# Patient Record
Sex: Male | Born: 1950 | Race: White | Hispanic: No | Marital: Married | Smoking: Never smoker
Health system: Southern US, Community
[De-identification: ages and names within clinical notes are randomized; demographics above are authoritative.]

## PROBLEM LIST (undated history)

## (undated) DIAGNOSIS — D6851 Activated protein C resistance: Secondary | ICD-10-CM

## (undated) DIAGNOSIS — R42 Dizziness and giddiness: Secondary | ICD-10-CM

## (undated) DIAGNOSIS — Z9889 Other specified postprocedural states: Secondary | ICD-10-CM

## (undated) DIAGNOSIS — H401130 Primary open-angle glaucoma, bilateral, stage unspecified: Secondary | ICD-10-CM

## (undated) DIAGNOSIS — I2699 Other pulmonary embolism without acute cor pulmonale: Secondary | ICD-10-CM

## (undated) DIAGNOSIS — R112 Nausea with vomiting, unspecified: Secondary | ICD-10-CM

## (undated) DIAGNOSIS — K219 Gastro-esophageal reflux disease without esophagitis: Secondary | ICD-10-CM

## (undated) HISTORY — DX: Primary open-angle glaucoma, bilateral, stage unspecified: H40.1130

## (undated) HISTORY — DX: Dizziness and giddiness: R42

## (undated) HISTORY — DX: Activated protein C resistance: D68.51

## (undated) HISTORY — DX: Other pulmonary embolism without acute cor pulmonale: I26.99

## (undated) HISTORY — PX: SHOULDER ARTHROSCOPY: SHX128

## (undated) HISTORY — PX: OTHER SURGICAL HISTORY: SHX169

---

## 2009-08-20 ENCOUNTER — Emergency Department (HOSPITAL_BASED_OUTPATIENT_CLINIC_OR_DEPARTMENT_OTHER): Admission: EM | Admit: 2009-08-20 | Discharge: 2009-08-20 | Payer: Self-pay | Admitting: Emergency Medicine

## 2009-08-20 ENCOUNTER — Ambulatory Visit: Payer: Self-pay | Admitting: Diagnostic Radiology

## 2009-08-31 ENCOUNTER — Emergency Department (HOSPITAL_BASED_OUTPATIENT_CLINIC_OR_DEPARTMENT_OTHER): Admission: EM | Admit: 2009-08-31 | Discharge: 2009-08-31 | Payer: Self-pay | Admitting: Emergency Medicine

## 2009-08-31 ENCOUNTER — Ambulatory Visit: Payer: Self-pay | Admitting: Diagnostic Radiology

## 2010-03-23 HISTORY — PX: BACK SURGERY: SHX140

## 2010-06-09 LAB — URINE MICROSCOPIC-ADD ON

## 2010-06-09 LAB — URINALYSIS, ROUTINE W REFLEX MICROSCOPIC
Bilirubin Urine: NEGATIVE
Glucose, UA: NEGATIVE mg/dL
Ketones, ur: NEGATIVE mg/dL
Leukocytes, UA: NEGATIVE

## 2010-06-20 ENCOUNTER — Other Ambulatory Visit (HOSPITAL_COMMUNITY): Payer: Self-pay | Admitting: Orthopedic Surgery

## 2010-06-20 ENCOUNTER — Ambulatory Visit (HOSPITAL_COMMUNITY)
Admission: RE | Admit: 2010-06-20 | Discharge: 2010-06-20 | Disposition: A | Discharge status: Home / Self Care | Payer: 59 | Source: Ambulatory Visit | Attending: Orthopedic Surgery | Admitting: Orthopedic Surgery

## 2010-06-20 ENCOUNTER — Encounter (HOSPITAL_COMMUNITY)
Admission: RE | Admit: 2010-06-20 | Discharge: 2010-06-20 | Disposition: A | Discharge status: Home / Self Care | Payer: 59 | Source: Ambulatory Visit | Attending: Orthopedic Surgery | Admitting: Orthopedic Surgery

## 2010-06-20 DIAGNOSIS — Z01812 Encounter for preprocedural laboratory examination: Secondary | ICD-10-CM | POA: Insufficient documentation

## 2010-06-20 DIAGNOSIS — Z01810 Encounter for preprocedural cardiovascular examination: Secondary | ICD-10-CM | POA: Insufficient documentation

## 2010-06-20 DIAGNOSIS — Z01811 Encounter for preprocedural respiratory examination: Secondary | ICD-10-CM

## 2010-06-20 DIAGNOSIS — Z01818 Encounter for other preprocedural examination: Secondary | ICD-10-CM | POA: Insufficient documentation

## 2010-06-20 LAB — SURGICAL PCR SCREEN
MRSA, PCR: NEGATIVE
Staphylococcus aureus: NEGATIVE

## 2010-06-20 LAB — PROTIME-INR: Prothrombin Time: 13 seconds (ref 11.6–15.2)

## 2010-06-20 LAB — URINALYSIS, ROUTINE W REFLEX MICROSCOPIC
Nitrite: NEGATIVE
Protein, ur: NEGATIVE mg/dL
Specific Gravity, Urine: 1.019 (ref 1.005–1.030)
Urobilinogen, UA: 1 mg/dL (ref 0.0–1.0)

## 2010-06-20 LAB — CBC
HCT: 46.5 % (ref 39.0–52.0)
Hemoglobin: 16 g/dL (ref 13.0–17.0)
Platelets: 243 10*3/uL (ref 150–400)
RBC: 4.98 MIL/uL (ref 4.22–5.81)

## 2010-06-20 LAB — COMPREHENSIVE METABOLIC PANEL
Albumin: 4 g/dL (ref 3.5–5.2)
Alkaline Phosphatase: 61 U/L (ref 39–117)
BUN: 15 mg/dL (ref 6–23)
GFR calc non Af Amer: >60 mL/min (ref >60)
Sodium: 142 mEq/L (ref 135–145)
Total Bilirubin: 1.1 mg/dL (ref 0.3–1.2)
Total Protein: 7.2 g/dL (ref 6.0–8.3)

## 2010-06-20 LAB — DIFFERENTIAL
Basophils Relative: 1 % (ref 0–1)
Lymphs Abs: 2.2 10*3/uL (ref 0.7–4.0)
Monocytes Relative: 10 % (ref 3–12)

## 2010-06-20 LAB — TYPE AND SCREEN
ABO/RH(D): O POS
Antibody Screen: NEGATIVE

## 2010-06-24 ENCOUNTER — Other Ambulatory Visit: Payer: Self-pay | Admitting: Orthopedic Surgery

## 2010-06-24 ENCOUNTER — Inpatient Hospital Stay (HOSPITAL_COMMUNITY)
Admission: RE | Admit: 2010-06-24 | Discharge: 2010-06-26 | DRG: 460 | Disposition: A | Discharge status: Home / Self Care | Payer: 59 | Source: Ambulatory Visit | Attending: Orthopedic Surgery | Admitting: Orthopedic Surgery

## 2010-06-24 ENCOUNTER — Inpatient Hospital Stay (HOSPITAL_COMMUNITY): Payer: 59

## 2010-06-24 DIAGNOSIS — M5126 Other intervertebral disc displacement, lumbar region: Secondary | ICD-10-CM | POA: Diagnosis present

## 2010-06-24 DIAGNOSIS — Z981 Arthrodesis status: Secondary | ICD-10-CM

## 2010-06-24 DIAGNOSIS — M47817 Spondylosis without myelopathy or radiculopathy, lumbosacral region: Principal | ICD-10-CM | POA: Diagnosis present

## 2010-06-25 LAB — BASIC METABOLIC PANEL
BUN: 13 mg/dL (ref 6–23)
Calcium: 7.6 mg/dL — ABNORMAL LOW (ref 8.4–10.5)
Chloride: 105 mEq/L (ref 96–112)
Creatinine, Ser: 1.13 mg/dL (ref 0.4–1.5)
GFR calc Af Amer: >60 mL/min (ref >60)
GFR calc non Af Amer: >60 mL/min (ref >60)
Glucose, Bld: 119 mg/dL — ABNORMAL HIGH (ref 70–99)
Potassium: 4.2 mEq/L (ref 3.5–5.1)
Sodium: 137 mEq/L (ref 135–145)

## 2010-06-25 LAB — CBC
HCT: 37.2 % — ABNORMAL LOW (ref 39.0–52.0)
Hemoglobin: 12.6 g/dL — ABNORMAL LOW (ref 13.0–17.0)
MCV: 93 fL (ref 78.0–100.0)

## 2010-06-25 NOTE — Op Note (Signed)
NAME:  KERIM, STATZER NO.:  [id number]  MEDICAL RECORD NO.:  [id number]           PATIENT TYPE:  I  LOCATION:  5009                         FACILITY:  [facility]  PHYSICIAN:  Nelda Severe, MD      DATE OF BIRTH:  05/09/1950  DATE OF PROCEDURE:  06/24/2010 DATE OF DISCHARGE:                              OPERATIVE REPORT   SURGEON:  Nelda Severe, MD  ASSISTANT:  Lianne Cure, PA-C  PREOPERATIVE DIAGNOSES: 1. Status post L5-S1 arthrodesis (solid). 2. L3-4, L4-5 lumbar spondylosis. 3. Left L4-5 disk herniation with left sciatic pain.  POSTOPERATIVE DIAGNOSES: 1. Status post L5-S1 arthrodesis (solid). 2. L3-4, L4-5 lumbar spondylosis. 3. Left L4-5 disk herniation with left sciatic pain.  OPERATIVE PROCEDURES: 1. L3-4, L4-5 posterior and transforaminal interbody fusions. 2. Removal of previously placed pedicle screws and rods, L5-S1     bilaterally, insertion of pedicle screws bilaterally at L5     (reinsertion) L4, and L3 (K2M) with titanium rods. 3. Insertion of Titan interbody cages at L3-4 and L4-5. 4. Left L4-5 disk excision (independent procedure). 5. Harvest right posterior iliac crest graft.  OPERATIVE NOTE:  The patient was placed under general endotracheal anesthesia.  A Foley catheter was placed in the bladder.  Sequential compression devices were placed bilaterally.  Intravenous antibiotics were infused for prophylaxis against infection.  The patient was positioned prone on a Jackson frame.  Care was taken to position the upper extremities so as to avoid hyperflexion and abduction of the shoulders so as to avoid hyperflexion of the elbows.  The thighs, knees, shins, and ankles were supported on pillows.  The previous midline incision was marked with a skin marker and the marks extended vertically, distally, and proximally to approximate the incision we are going to make.  Another mark was made over the proposed incision site for bone graft  harvest over the right iliac crest.  The lumbar area was prepped with DuraPrep and draped in rectangular fashion.  The drapes were secured with Ioban.  At this point, a time-out was held at which point the patient's identity and the usual parameters were discussed/confirmed.  The skin was scored in an elliptical fashion around the upper portion of the previous midline incision and scored in line with the skin mark placed earlier.  The subcutaneous tissue was then injected with a mixture of 0.5% plain Marcaine and 1% lidocaine with epinephrine. Cutting current was used to excise the scar and deepen the incision to the posterior spinous processes.  Cross-table lateral radiograph was taken with a Kocher on what we perceived to be the L3 spinous process. We further exposed the spine, exposing the fixation screws and rods in place at L5-S1 as well as the transverse processes of L4 and L3 bilaterally.  The set screws were removed and rods removed from the ends screws which were in situ.  The screws were then removed and the holes injected with FloSeal to prevent bleeding.  We then initially on the patient's right side made pedicle holes at L4 and L3 and further exposed the posterolateral fusion mass at L5-S1.  Pedicle holes  were placed at L4 and L3 in the usual fashion, removing a small piece of bone from the space at superior articular process, perforating into the pedicle with an awl and then using a pedicle probe to make a hole through the pedicle into the vertebral body.  Each hole was measured for depth and palpated circumferentially to make sure there were no circumferential breaches. Each hole was then tapped for a 6.5-mm screw.  At the L5 level a new 7.5 mm K2M screw was inserted because the previous screws removed were 6.5 mm.  We then placed 6.5 mm screws at L4 and L3.  Prior to placing the screws, we decorticated, using a high-speed bur the fusion mass at L5-S1 as well as  transverse processes at L4-L3.  While we had been taken a localizing radiograph, we harvested an autogenous iliac crest graft from the posterior iliac crest through a short incision, using a bone retaining disposable bone graft harvester as well as curettes and gouges, having removed the piece of the posterosuperior surface of the iliac crest.  All of bone harvested was from between the tables of the ileum except for the small amount of bone used to fenestrate the bone.  This was mixed with bone which I removed with an osteotome from the inferior articular process of L3 and L4 and mixed with bioglass.  Graft was placed posterolaterally on the right side between the transverse processes of L3, L4, and the fusion mass. The screws were then inserted.  All of the screws were stimulated and distal EMG activity report, all of the thresholds were well above a safe lower threshold level.  I then switched table sides to the left and completed the exposure to the transverse processes of L3-L4.  Holes were again made in the same manner described for the right-hand side.  The transverse process were then decorticated as was the fusion mass and bone graft bioglass mixture placed posterolaterally.  We then inserted the screws having tapped the holes.  7.5-mm screw was again used at L5, which previously had 6.5 mm screw in it and at L4-L3.  I then placed a working rod and distracted the screws at L4 and 5 to open up the interlaminar space to facilitate the transforaminal interbody fusion, which we were going to perform as well as facilitate the disk excision.  We removed the inferior articular process almost completely with osteotomes and this was morselized and used for more graft.  We then enlarged the exposure by removing some of the trailing edge of the L4 lamina.  The superior articular process of L5 was largely removed.  Venous bleeders were coagulated in the neural foramen and the disk  exposed.  It contained a fragment of disk which had burst through most of the annulus and was beyond the confines of the vertebral body as indicated on this MRI scan.  The annulus was fenestrated and the sequestrated fragments of disk removed using Epstein curettes and pituitary rongeurs.  We then further prepared the disk space for fusion by means of various types of curettes and pituitary rongeurs. Eventually I placed a 12-mm intradiskal distractor, loosened the screws on the working rod, distracted the disk space up more and then held it distracted with the working rod by tightening up the set screws.  We then used a special funnel to place bone graft anteriorly in the disk space.  A Titan 10-mm cage was then loaded with bone graft and the cage inserted in an oblique  fashion.  We then removed the set screws along the disk space to contract and the endplates to grip the Titan cage.  We then removed the working rod and smoothed it to L3-4 on the left side.  Facetectomy was then carried out as described for L4-5.  The foramen was exposed and bleeders coagulated.  There was no significant disk herniation at this level.  An annulotomy was carried out and then preparation of the disk space as described for L4-5 was carried out. Again the disk space was distracted up to 11 or 12 mm with an intradiskal distractor and held distracted position while a 10-mm Titan cage loaded with graft material was placed, following our placement of graft anteriorly in the disk space.  Again the working rod was removed allowing the endplates to grip the implant.  At this point, cross-table lateral radiograph was taken, which showed satisfactory position of cages and screws.  The screws on the left side were all stimulated with satisfactory results.  We then contoured 2 titanium rods and placed them bilaterally and all the couplings were torqued.  I then used a high-speed bur to further denude the bone off  the superior articular process of L5 and L4 as well of the lamina of L3 and L4.  The remaining bone graft was packed in posteriorly.  A piece of Gelfoam was then placed over it to prevent it from migrating into the wound after closure.  We did inject FloSeal into the both disk spaces until it flowed out over into the laminectomy site.  I also placed a slab of Gelfoam superficial to the laminectomies on the left side to prevent the ingress of bone graft chips.  There was some remaining bone graft, which was packed in posterolaterally largely on the patient's right side.  A 15-gauge Blake drain was then placed subfascially and brought out through the skin to the right side and secured with a 2-0 nylon suture. The thoracolumbar fascia was closed using interrupted #1 Vicryl sutures. An 8-inch Hemovac drain was brought from the subcutaneous layer of the bone graft harvest wound into the central wound out through the skin on the right side, again secured with 2-0 nylon suture.  The subcutaneous layer of both wounds was closed using interrupted inverted 2-0 Vicryl suture.  The skin of both wounds were closed using 3-0 undyed Vicryl in subcuticular continuous fashion.  Skin edges were reinforced with Steri- Strips.  Nonadherent dressing was applied.  There were no intraoperative complications.  Sponge and needle counts were correct.  The blood loss estimated at 600 to 700 mL.  The patient was stable throughout the procedure.  The patient has not yet been examined in the recovery room, so no neurologic exam is reported here.     Nelda Severe, MD     MT/MEDQ  D:  06/24/2010  T:  06/25/2010  Job:  [id number]  Electronically Signed by Nelda Severe MD on 06/25/2010 09:04:30 AM

## 2010-06-25 NOTE — H&P (Signed)
  NAME:  Wayne Arnold, Wayne Arnold NO.:  [id number]  MEDICAL RECORD NO.:  [id number]           PATIENT TYPE:  I  LOCATION:  5009                         FACILITY:  [facility]  PHYSICIAN:  Nelda Severe, MD      DATE OF BIRTH:  10-20-1950  DATE OF ADMISSION:  06/24/2010 DATE OF DISCHARGE:                             HISTORY & PHYSICAL   This man is admitted today for management of back and left leg pain secondary to disk herniations at L3-4 and L4-5, above a previous L5-S1 fusion.  He has had persistent pain, worsening for a several years at this point.  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  The informed consent process was executed at this time as follows:  The description of the likely hospital course including 3 or 4 days in the hospital, means of a Foley catheter in the bladder until the first postoperative day, use of a PCA pump for delivery of narcotics were all discussed.  In a likelihood, he will be mobilized with physical therapy on the first postoperative day.  The bone graft cannot heal in less than 8 months and so for a period of at least 8 months, he will be restricted from bending and lifting.  Postoperatively, walking will be his physical therapy.  General risks were discussed as follows:  Death, heart attack, stroke, phlebitis in leg or pelvic veins, pulmonary embolus, pneumonia, infection, blood loss sufficient to require transfusion, with very small risk of HIV, hepatitis or transfusion reaction.  Specific risks were discussed as follows:  Nerve injury with increased pain new or worsening numbness, motor deficit (essentially worsening condition); spinal fluid leak with the need for repair and recumbency postoperatively for 3 days; epidural hematoma with possible need for return to the operating room; failure of graft healing; breakage or failure of implants with a need for reoperation; new problems at new levels in the future  requiring surgery.     Nelda Severe, MD     MT/MEDQ  D:  06/24/2010  T:  06/25/2010  Job:  [id number]  Electronically Signed by Nelda Severe MD on 06/25/2010 09:03:24 AM

## 2010-06-26 LAB — BASIC METABOLIC PANEL
BUN: 13 mg/dL (ref 6–23)
CO2: 25 mEq/L (ref 19–32)
Calcium: 7.6 mg/dL — ABNORMAL LOW (ref 8.4–10.5)
Creatinine, Ser: 0.96 mg/dL (ref 0.4–1.5)
GFR calc Af Amer: >60 mL/min (ref >60)
GFR calc non Af Amer: >60 mL/min (ref >60)
Glucose, Bld: 118 mg/dL — ABNORMAL HIGH (ref 70–99)
Potassium: 3.5 mEq/L (ref 3.5–5.1)

## 2010-06-26 LAB — CBC
HCT: 35.9 % — ABNORMAL LOW (ref 39.0–52.0)
Hemoglobin: 12.2 g/dL — ABNORMAL LOW (ref 13.0–17.0)

## 2010-07-21 ENCOUNTER — Ambulatory Visit
Admission: RE | Admit: 2010-07-21 | Discharge: 2010-07-21 | Disposition: A | Discharge status: Home / Self Care | Payer: 59 | Source: Ambulatory Visit | Attending: Orthopedic Surgery | Admitting: Orthopedic Surgery

## 2010-07-21 ENCOUNTER — Other Ambulatory Visit: Payer: Self-pay | Admitting: Orthopedic Surgery

## 2010-07-21 DIAGNOSIS — Z9889 Other specified postprocedural states: Secondary | ICD-10-CM

## 2010-07-24 ENCOUNTER — Inpatient Hospital Stay (HOSPITAL_COMMUNITY): Payer: 59

## 2010-07-24 ENCOUNTER — Ambulatory Visit (HOSPITAL_COMMUNITY)
Admission: RE | Admit: 2010-07-24 | Discharge: 2010-07-25 | DRG: 983 | Disposition: A | Discharge status: Home / Self Care | Payer: 59 | Source: Ambulatory Visit | Attending: Orthopedic Surgery | Admitting: Orthopedic Surgery

## 2010-07-24 DIAGNOSIS — T84498A Other mechanical complication of other internal orthopedic devices, implants and grafts, initial encounter: Secondary | ICD-10-CM | POA: Insufficient documentation

## 2010-07-24 DIAGNOSIS — Y831 Surgical operation with implant of artificial internal device as the cause of abnormal reaction of the patient, or of later complication, without mention of misadventure at the time of the procedure: Secondary | ICD-10-CM | POA: Insufficient documentation

## 2010-07-24 DIAGNOSIS — M129 Arthropathy, unspecified: Secondary | ICD-10-CM | POA: Insufficient documentation

## 2010-07-24 LAB — COMPREHENSIVE METABOLIC PANEL
ALT: 10 U/L (ref 0–53)
AST: 12 U/L (ref 0–37)
Albumin: 3.4 g/dL — ABNORMAL LOW (ref 3.5–5.2)
Calcium: 9 mg/dL (ref 8.4–10.5)
GFR calc Af Amer: >60 mL/min (ref >60)
Sodium: 139 mEq/L (ref 135–145)
Total Protein: 5.9 g/dL — ABNORMAL LOW (ref 6.0–8.3)

## 2010-07-24 LAB — DIFFERENTIAL
Basophils Absolute: 0 10*3/uL (ref 0.0–0.1)
Basophils Relative: 0 % (ref 0–1)
Eosinophils Relative: 2 % (ref 0–5)
Monocytes Absolute: 0.9 10*3/uL (ref 0.1–1.0)
Neutro Abs: 6.2 10*3/uL (ref 1.7–7.7)

## 2010-07-24 LAB — CBC
Hemoglobin: 13 g/dL (ref 13.0–17.0)
MCHC: 33.5 g/dL (ref 30.0–36.0)
RDW: 12.9 % (ref 11.5–15.5)

## 2010-07-24 LAB — TYPE AND SCREEN: Antibody Screen: NEGATIVE

## 2010-07-24 LAB — PROTIME-INR: Prothrombin Time: 14.4 seconds (ref 11.6–15.2)

## 2010-07-27 NOTE — Discharge Summary (Signed)
  NAME:  Wayne Arnold, Wayne Arnold NO.:  [id number]  MEDICAL RECORD NO.:  [id number]           PATIENT TYPE:  LOCATION:                                 FACILITY:  PHYSICIAN:  Nelda Severe, MD      DATE OF BIRTH:  1950/10/19  DATE OF ADMISSION: DATE OF DISCHARGE:                              DISCHARGE SUMMARY   FINAL DIAGNOSIS:  Status post L5-S1 arthrodesis, probably fused, with herniated disk at L3-4, L4-5, and spondylosis, left sciatic pain.  BRIEF HISTORY:  He was brought to [facility] on June 24, 2010. He underwent surgery by Dr. Nelda Severe, fusion L3-4, L4-5, replacement of hardware at L5-S1.  The patient tolerated surgery well. Postoperatively, he was grossly neurovascularly motor intact.  Estimated blood loss 700 mL.  He was admitted to room 5009.  Postop day #1, the patient was comfortable overall.  He did not use his Dilaudid PCA.  He was on hydrocodone 5/325 one to two q.4 p.r.n. for pain, which he used sparingly.  Grossly neurovascularly motor intact.  Drain output total was approximately 700 mL.  Postop day #2, the patient was doing well. Drains were discontinued.  Clean-dry dressing was applied.  Very slight skin irritation from the periphery of the dressing.  No blistering was noted.  He is able to walk independently without the use of any assistive device.  Clean-dry dressing was applied.  The patient tolerated this well.  FINAL DIAGNOSIS:  L3-4, L4-5, herniated disk with extension of fusion, status post L5-S1 fusion.  PLAN:  He will walk for exercise.  No bending, stooping, lifting greater than 5-10 pounds.  Increase activity slowly.  He will be discharged home with hydrocodone 5/325 one to two q.4 p.r.n. for pain as well as Robaxin 500 mg one to two q.6 p.r.n. for muscle spasms.  He will follow up in our office in approximately 4 weeks.  If he has troubles or concerns, he is told to call our at anytime.  He may shower tomorrow.  Remove  the dressing prior to shower.  If there is no drainage, no dressing is needed.  DISPOSITION:  Stable.  DIET:  Regular.     Lianne Cure, P.A.   ______________________________ Nelda Severe, MD    MC/MEDQ  D:  06/26/2010  T:  06/26/2010  Job:  [id number]  Electronically Signed by Lianne Cure P.A. on 07/25/2010 12:07:55 PM Electronically Signed by Nelda Severe MD on 07/27/2010 01:32:13 PM

## 2010-07-27 NOTE — Op Note (Signed)
NAME:  Wayne Arnold, Wayne Arnold NO.:  [id number]  MEDICAL RECORD NO.:  [id number]           PATIENT TYPE:  I  LOCATION:  5004                         FACILITY:  [facility]  PHYSICIAN:  Nelda Severe, MD      DATE OF BIRTH:  [birth year]  DATE OF PROCEDURE:  07/24/2010 DATE OF DISCHARGE:                              OPERATIVE REPORT   SURGEON:  Nelda Severe, MD  ASSISTANT:  Lianne Cure, PA-C.  PREOPERATIVE DIAGNOSIS:  Mechanical failure of pedicle screw/rod fixation post L3-L5 fusion.  POSTOPERATIVE DIAGNOSIS:  Mechanical failure of pedicle screw/rod fixation post L3-L5 fusion.  OPERATIVE PROCEDURE:  Revision of rod screw construct bilaterally L3-L5, replacing rods and set screws.  OPERATIVE NOTE:  The patient was placed under general endotracheal anesthesia and positioned prone on a Jackson frame.  The thoracolumbar area was prepped with DuraPrep and draped in rectangular fashion.  Care was taken to position the arms so as to avoid hyperflexion and abduction of the shoulders and so as to avoid hyperflexion the elbows.  The upper extremities were padded from axilla to hands with foam.  Thighs, knees, shins and ankles were supported on pillows.  Subsequent to the patient being anesthetized, but prior to preparation and draping, I reviewed his CT scan which had been ordered preoperatively.  I had reviewed this before and confirmed that there was decoupling of the proximal right screw with the rod, but I could not quite define the nature of what it happened.  When I was reviewing the CT scan again, I realized that the set screw from the upper left screw had actually backed out and one of the rod was still in the saddle of the pedicle screw, the set screws displaced.  This is something I had not appreciated when I had previously reviewed the CT scan.  For this reason, I then decided I would need to try to determine what had gone on both right and left sides in the  interval since his surgery.  The lumbar area was prepped with DuraPrep and draped in rectangular fashion.  The drapes secured with Ioban.  The skin was scored in elliptical fashion around the prior midline incision, the subcutaneous layer was injected with mixture of 0.25% plain Marcaine and 1% lidocaine with epinephrine.  Using cutting current, the incision/immature scar was excised.  Subcutaneous sutures were removed and the tissue divided down to the thoracolumbar fascia.  This deep sutures in the thoracolumbar fascia were removed and the thoracolumbar fascia spread apart without much difficulty or need for further dissection.  There was a fluid- filled cavity posterior to the fusion construct.  Retractors were placed.  I initially inspected the left side and easily found the displaced set screw.  At this point, I then inspected the right side and saw the rod had backed out distally, but the set screw in the proximal screw had not backed out of its position.  Obviously, on the right side none of the set screws had held because of the fact that the rod had displaced distally at each junction.  I then removed all of the set  screws from the right side and the rod.  I carefully inspected the saddles on the pedicle screws to see if there is any evidence of galling or abrasion of the threads and there was no evidence of the threads been damaged.  At this point, I concluded that none of the right sided set screws had been adequately torqued, insofar as they had obviously not held.  I then turned my attention to the patient's left side, removed all the set screws and the rod and again inspected all the saddles and there was no evidence of any damage to the threads.  I certainly was left with the conclusion that the uppermost set screw was not adequately torqued in view of the fact that it was completely backed out.  However, the rod had not displaced.  New rods were chosen, 5 mm longer than  the ones originally inserted so that there would be more rod overhang at either end and we did this first on the patient's right.  I then provisionally fixed all the screws and proceeded to torque them with the torque screwdriver which has a giveaway mechanism, calibrated so that there was sudden loss of resistance when the appropriate torque level was reached.  I then applied another screwdriver, where the torque level was determined by a gauge on the barrel of the screwdriver just below the handle  and at in each instance was able to further turn the screw before the gauge read that I had adequately torqued the screw.  We know that I had used the same screwdriver with the giveaway type of mechanism to torque the screws originally.  Therefore, I concluded that the most likely problem and what I perceived to be an inadequate torquing of the screws was that the screwdriver calibration was incorrect.  I contoured another rod, similar length, placed it on the patient's left side and again inserted the screws and torqued them with the first "giveaway" type of torque screwdriver and then torqued them with the other screwdriver and at an each instance I was able to turn the screw and a little farther then with the original screwdriver.  On several occasions throughout the procedure, the wound was thoroughly irrigated with antibiotic solution.  At one point, one of the set screws which I was removing from the patient's left side fell off the end of the screwdriver and we could not locate it until we took AP radiograph and we were able to find it, actually having fallen into the laminectomy defect at L4-5.  It was retrieved easily, once we knew where it was.  Having torqued all the screws and replaced the rods, and used new set screws in each instance, the 15-gauge Blake drain was placed subfascially and brought out through the skin to the right side where it was secured with a 2-0 nylon  suture.  The thoracolumbar fascia and muscle were closed in layers using interrupted #1 Vicryl sutures.  An 8- inch Hemovac drain was placed in the subcutaneous layer and brought out through the skin to the right side where it was secured with a 2-0 nylon suture.  The skin was closed using a subcuticular 3-0 undyed running Vicryl suture and the skin edges reinforced with Dermabond.  Nonadherent dressing was applied.  The blood loss estimated at 150 mL.  There were no intraoperative complications.  The patient was awakened and return to the recovery room in satisfactory condition, where he can move both feet and ankles.  He was  stable throughout the procedure.  Subsequent to the procedure, I did review my prior operative note from the surgery performed on June 24, 2010.  I clearly stated that the set screws were all torqued at that time.  That note was dictated contemporaneously and I am confident that this reflects what occurred at the procedure accurately.  At the present time, the patient is not awake enough to explain him what we found at the time of surgery and what I have concluded, but I have spoken to his wife and outlined to her essentially what I have outlined in this note as to my thoughts on the cause of loosening and the need for the surgery.     Nelda Severe, MD     MT/MEDQ  D:  07/24/2010  T:  07/25/2010  Job:  [id number]  Electronically Signed by Nelda Severe MD on 07/27/2010 01:40:43 PM

## 2010-09-17 NOTE — Discharge Summary (Signed)
  Wayne Arnold, Wayne Arnold NO.:  [id number]  MEDICAL RECORD NO.:  [id number]           PATIENT TYPE:  I  LOCATION:  5004                         FACILITY:  [facility]  PHYSICIAN:  Nelda Severe, MD      DATE OF BIRTH:  July 10, 1950  DATE OF ADMISSION:  07/24/2010 DATE OF DISCHARGE:  07/25/2010                              DISCHARGE SUMMARY   FINAL DIAGNOSIS:  Hardware failure, status post fusion.  BRIEF HISTORY:  He was brought to [facility] on by Jul 24, 2010 where he underwent a hardware revision by Dr. Nelda Severe.  Surgery was uneventful.  Postoperative day #1, on Jul 25, 2010, the patient was independently walking.  The pain was well controlled with p.o. medications.  He was taking p.o.'s well.  We are going to discharge him home.  His disposition is stable.  Diet is regular.  He will follow up in our office in approximately 4 weeks.  FINAL DIAGNOSIS:  Hardware failure, status post lumbar fusion.     Lianne Cure, P.A.   ______________________________ Nelda Severe, MD    MC/MEDQ  D:  08/14/2010  T:  08/15/2010  Job:  [id number]  Electronically Signed by Lianne Cure P.A. on 09/10/2010 04:33:35 PM Electronically Signed by Nelda Severe MD on 09/17/2010 05:11:43 PM

## 2010-10-23 NOTE — Discharge Summary (Signed)
  NAMEPERLIE, SCHEURING NO.:  [id number]  MEDICAL RECORD NO.:  [id number]  LOCATION:                                 FACILITY:  PHYSICIAN:  Wayne Severe, MD      DATE OF BIRTH:  04-25-1950  DATE OF ADMISSION:  07/24/2010 DATE OF DISCHARGE:  07/25/2010                              DISCHARGE SUMMARY   FINAL DIAGNOSIS:  Hardware failure, status post fusion, lumbar spine.  BRIEF HISTORY:  He was brought to [facility] on Jul 24, 2010.   pedical screw caps were removed and replaced with new caps.  He was given his regular po medications post op.  He was afebrial and taking PO's well the next morning.  Ambulating independently.  The wound was clean dry and intact.  Distally N/V/M intact  equal billaterally.  Patient was discharged home under the care of his family.  F/U in the office with Dr. Alveda Reasons in 2 weeks.  Norco 10/325 1 q4 PO PRN for pain.   Disp. stable Diet regular. Final diagnosis hardware falure s/p lumbar fusion.     Lianne Cure, P.A.   ______________________________ Wayne Severe, MD    MC/MEDQ  D:  08/14/2010  T:  08/15/2010  Job:  [id number]  Electronically Signed by Lianne Cure P.A. on 10/21/2010 02:14:22 PM Electronically Signed by Wayne Severe MD on 10/23/2010 02:51:16 PM

## 2012-02-28 ENCOUNTER — Emergency Department (HOSPITAL_BASED_OUTPATIENT_CLINIC_OR_DEPARTMENT_OTHER): Payer: BC Managed Care – PPO

## 2012-02-28 ENCOUNTER — Encounter (HOSPITAL_BASED_OUTPATIENT_CLINIC_OR_DEPARTMENT_OTHER): Payer: Self-pay | Admitting: *Deleted

## 2012-02-28 ENCOUNTER — Emergency Department (HOSPITAL_BASED_OUTPATIENT_CLINIC_OR_DEPARTMENT_OTHER)
Admission: EM | Admit: 2012-02-28 | Discharge: 2012-02-28 | Disposition: A | Discharge status: Home / Self Care | Payer: BC Managed Care – PPO | Attending: Emergency Medicine | Admitting: Emergency Medicine

## 2012-02-28 DIAGNOSIS — J159 Unspecified bacterial pneumonia: Secondary | ICD-10-CM | POA: Insufficient documentation

## 2012-02-28 DIAGNOSIS — J189 Pneumonia, unspecified organism: Secondary | ICD-10-CM

## 2012-02-28 DIAGNOSIS — R0602 Shortness of breath: Secondary | ICD-10-CM | POA: Insufficient documentation

## 2012-02-28 DIAGNOSIS — Y9389 Activity, other specified: Secondary | ICD-10-CM | POA: Insufficient documentation

## 2012-02-28 DIAGNOSIS — S2239XA Fracture of one rib, unspecified side, initial encounter for closed fracture: Secondary | ICD-10-CM

## 2012-02-28 DIAGNOSIS — S2249XA Multiple fractures of ribs, unspecified side, initial encounter for closed fracture: Secondary | ICD-10-CM | POA: Insufficient documentation

## 2012-02-28 DIAGNOSIS — X500XXA Overexertion from strenuous movement or load, initial encounter: Secondary | ICD-10-CM | POA: Insufficient documentation

## 2012-02-28 DIAGNOSIS — Y929 Unspecified place or not applicable: Secondary | ICD-10-CM | POA: Insufficient documentation

## 2012-02-28 DIAGNOSIS — J3489 Other specified disorders of nose and nasal sinuses: Secondary | ICD-10-CM | POA: Insufficient documentation

## 2012-02-28 DIAGNOSIS — M549 Dorsalgia, unspecified: Secondary | ICD-10-CM | POA: Insufficient documentation

## 2012-02-28 DIAGNOSIS — R079 Chest pain, unspecified: Secondary | ICD-10-CM | POA: Insufficient documentation

## 2012-02-28 DIAGNOSIS — R509 Fever, unspecified: Secondary | ICD-10-CM | POA: Insufficient documentation

## 2012-02-28 MED ORDER — OXYCODONE-ACETAMINOPHEN 5-325 MG PO TABS
2.0000 | ORAL_TABLET | Freq: Once | ORAL | Status: AC
  Administered 2012-02-28: 2 via ORAL
  Filled 2012-02-28 (×2): qty 2

## 2012-02-28 MED ORDER — AZITHROMYCIN 250 MG PO TABS: 250.0000 mg | ORAL_TABLET | Freq: Every day | ORAL | Status: DC

## 2012-02-28 MED ORDER — OXYCODONE-ACETAMINOPHEN 5-325 MG PO TABS: 2.0000 | ORAL_TABLET | ORAL | Status: DC | PRN

## 2012-02-28 NOTE — ED Provider Notes (Signed)
History     CSN: [id number]  Arrival date & time 02/28/12  1443   First MD Initiated Contact with Patient 02/28/12 1541      Chief Complaint  Patient presents with  . Back Pain    (Consider location/radiation/quality/duration/timing/severity/associated sxs/prior treatment) Patient is a 61 y.o. male presenting with cough. The history is provided by the patient. No language interpreter was used.  Cough This is a new problem. Episode onset: 3 days. The problem occurs constantly. The problem has been gradually worsening. The cough is productive of sputum. The maximum temperature recorded prior to his arrival was 100 to 100.9 F. The fever has been present for 1 to 2 days. Associated symptoms include chest pain and shortness of breath. Pertinent negatives include no sore throat. He has tried nothing for the symptoms. He is not a smoker. His past medical history does not include bronchitis.  Pt complains of a cough and congestion since Friday.   Pt reports he coughed and had severe   History reviewed. No pertinent past medical history.  History reviewed. No pertinent past surgical history.  History reviewed. No pertinent family history.  History  Substance Use Topics  . Smoking status: Never Smoker   . Smokeless tobacco: Not on file  . Alcohol Use: Yes      Review of Systems  HENT: Negative for sore throat.   Respiratory: Positive for cough and shortness of breath.   Cardiovascular: Positive for chest pain.  All other systems reviewed and are negative.    Allergies  Review of patient's allergies indicates no known allergies.  Home Medications  No current outpatient prescriptions on file.  BP 110/75  Pulse 92  Temp 98.8 F (37.1 C) (Oral)  Resp 20  Ht 6\' 4"  (1.93 m)  Wt 210 lb (95.255 kg)  BMI 25.56 kg/m2  SpO2 100%  Physical Exam  Nursing note and vitals reviewed. Constitutional: He appears well-developed and well-nourished.  HENT:  Head: Normocephalic.  Right  Ear: External ear normal.  Left Ear: External ear normal.  Nose: Nose normal.  Mouth/Throat: Oropharynx is clear and moist.  Eyes: Conjunctivae normal and EOM are normal. Pupils are equal, round, and reactive to light.  Neck: Normal range of motion. Neck supple.  Cardiovascular: Normal rate, regular rhythm and normal heart sounds.   Pulmonary/Chest: Effort normal and breath sounds normal.  Abdominal: Soft.  Musculoskeletal: Normal range of motion.  Neurological: He is alert.  Skin: Skin is warm.    ED Course  Procedures (including critical care time)  Labs Reviewed - No data to display Dg Ribs Unilateral W/chest Right  02/28/2012  *RADIOLOGY REPORT*  Clinical Data: Flu like symptoms.  Coughing spell and injured right posterior ribs.  RIGHT RIBS AND CHEST - 3+ VIEW  Comparison: Chest radiograph dated 06/20/2010  Findings: There are vague patchy densities in the right perihilar region that could represent subtle airspace disease and infection. No evidence for a pneumothorax.  Heart and mediastinum are within normal limits.  The patient has pedicle screw fixation in the lumbar spine. There may be subtle cortical irregularity involving the right seventh rib and eighth ribs.  IMPRESSION: Subtle patchy densities in the right lung could represent infection.  Mild cortical irregularity involving the right seventh and eighth ribs.  These could represent minimally-displaced fractures.   Original Report Authenticated By: Richarda Overlie, M.D.      1. Community acquired pneumonia   2. Rib fractures       MDM  Pt  given rx for percocet.   I will treat with zithromax.   I advised pt he needs to recheck with his MD in 2-3 days        Elson Areas, [place] 02/28/12 [address] [place], [place] 02/28/12 1613  Lonia Skinner [place], [place] 02/28/12 [phone]

## 2012-02-28 NOTE — ED Notes (Signed)
Thursday flu like symptoms Friday began with deep cough pain in mid upper right back pain increases with inspiration states he isnt short of breath but thinks maybe he twisted something or cracked a rib from coughing

## 2012-02-28 NOTE — ED Notes (Signed)
Pt returned from xray

## 2012-02-28 NOTE — ED Notes (Signed)
Patient transported to X-ray 

## 2012-02-28 NOTE — ED Provider Notes (Signed)
Medical screening examination/treatment/procedure(s) were performed by non-physician practitioner and as supervising physician I was immediately available for consultation/collaboration.   Hughie Melroy, MD 02/28/12 2307 

## 2012-03-01 ENCOUNTER — Emergency Department (HOSPITAL_BASED_OUTPATIENT_CLINIC_OR_DEPARTMENT_OTHER): Payer: BC Managed Care – PPO

## 2012-03-01 ENCOUNTER — Inpatient Hospital Stay (HOSPITAL_BASED_OUTPATIENT_CLINIC_OR_DEPARTMENT_OTHER)
Admission: EM | Admit: 2012-03-01 | Discharge: 2012-03-03 | DRG: 541 | Disposition: A | Discharge status: Home / Self Care | Payer: BC Managed Care – PPO | Attending: Internal Medicine | Admitting: Internal Medicine

## 2012-03-01 ENCOUNTER — Encounter (HOSPITAL_BASED_OUTPATIENT_CLINIC_OR_DEPARTMENT_OTHER): Payer: Self-pay | Admitting: *Deleted

## 2012-03-01 DIAGNOSIS — Z79899 Other long term (current) drug therapy: Secondary | ICD-10-CM

## 2012-03-01 DIAGNOSIS — Z7982 Long term (current) use of aspirin: Secondary | ICD-10-CM

## 2012-03-01 DIAGNOSIS — R9431 Abnormal electrocardiogram [ECG] [EKG]: Secondary | ICD-10-CM

## 2012-03-01 DIAGNOSIS — I2789 Other specified pulmonary heart diseases: Secondary | ICD-10-CM | POA: Diagnosis present

## 2012-03-01 DIAGNOSIS — J9600 Acute respiratory failure, unspecified whether with hypoxia or hypercapnia: Secondary | ICD-10-CM | POA: Diagnosis present

## 2012-03-01 DIAGNOSIS — J9601 Acute respiratory failure with hypoxia: Secondary | ICD-10-CM

## 2012-03-01 DIAGNOSIS — Z885 Allergy status to narcotic agent status: Secondary | ICD-10-CM

## 2012-03-01 DIAGNOSIS — I82409 Acute embolism and thrombosis of unspecified deep veins of unspecified lower extremity: Secondary | ICD-10-CM

## 2012-03-01 DIAGNOSIS — H409 Unspecified glaucoma: Secondary | ICD-10-CM

## 2012-03-01 DIAGNOSIS — M545 Low back pain, unspecified: Secondary | ICD-10-CM

## 2012-03-01 DIAGNOSIS — I2699 Other pulmonary embolism without acute cor pulmonale: Principal | ICD-10-CM

## 2012-03-01 DIAGNOSIS — R0781 Pleurodynia: Secondary | ICD-10-CM | POA: Diagnosis present

## 2012-03-01 DIAGNOSIS — Z792 Long term (current) use of antibiotics: Secondary | ICD-10-CM

## 2012-03-01 DIAGNOSIS — Z86718 Personal history of other venous thrombosis and embolism: Secondary | ICD-10-CM

## 2012-03-01 DIAGNOSIS — K219 Gastro-esophageal reflux disease without esophagitis: Secondary | ICD-10-CM | POA: Diagnosis present

## 2012-03-01 DIAGNOSIS — J40 Bronchitis, not specified as acute or chronic: Secondary | ICD-10-CM | POA: Diagnosis present

## 2012-03-01 DIAGNOSIS — I824Z9 Acute embolism and thrombosis of unspecified deep veins of unspecified distal lower extremity: Secondary | ICD-10-CM | POA: Diagnosis present

## 2012-03-01 HISTORY — DX: Nausea with vomiting, unspecified: R11.2

## 2012-03-01 HISTORY — DX: Nausea with vomiting, unspecified: Z98.890

## 2012-03-01 HISTORY — DX: Gastro-esophageal reflux disease without esophagitis: K21.9

## 2012-03-01 LAB — HEPARIN LEVEL (UNFRACTIONATED): Heparin Unfractionated: 0.14 IU/mL — ABNORMAL LOW (ref 0.30–0.70)

## 2012-03-01 LAB — URINALYSIS, ROUTINE W REFLEX MICROSCOPIC
Leukocytes, UA: NEGATIVE
Nitrite: NEGATIVE
Specific Gravity, Urine: 1.015 (ref 1.005–1.030)
Urobilinogen, UA: 1 mg/dL (ref 0.0–1.0)
pH: 6 (ref 5.0–8.0)

## 2012-03-01 LAB — CBC WITH DIFFERENTIAL/PLATELET
Basophils Relative: 0 % (ref 0–1)
Hemoglobin: 15 g/dL (ref 13.0–17.0)
Lymphs Abs: 1.5 10*3/uL (ref 0.7–4.0)
Monocytes Relative: 10 % (ref 3–12)
Neutro Abs: 11.1 10*3/uL — ABNORMAL HIGH (ref 1.7–7.7)
Neutrophils Relative %: 78 % — ABNORMAL HIGH (ref 43–77)
RBC: 4.55 MIL/uL (ref 4.22–5.81)

## 2012-03-01 LAB — CBC
MCH: 31.9 pg (ref 26.0–34.0)
MCHC: 34.5 g/dL (ref 30.0–36.0)
RDW: 12.8 % (ref 11.5–15.5)

## 2012-03-01 LAB — COMPREHENSIVE METABOLIC PANEL
ALT: 13 U/L (ref 0–53)
Albumin: 3.3 g/dL — ABNORMAL LOW (ref 3.5–5.2)
Alkaline Phosphatase: 65 U/L (ref 39–117)
BUN: 14 mg/dL (ref 6–23)
Chloride: 98 mEq/L (ref 96–112)
Glucose, Bld: 131 mg/dL — ABNORMAL HIGH (ref 70–99)
Potassium: 3.8 mEq/L (ref 3.5–5.1)
Sodium: 136 mEq/L (ref 135–145)
Total Bilirubin: 0.9 mg/dL (ref 0.3–1.2)

## 2012-03-01 LAB — TROPONIN I
Troponin I: <0.3 ng/mL (ref <0.30)
Troponin I: <0.3 ng/mL (ref <0.30)
Troponin I: <0.3 ng/mL (ref <0.30)
Troponin I: <0.3 ng/mL (ref <0.30)

## 2012-03-01 LAB — MRSA PCR SCREENING: MRSA by PCR: NEGATIVE

## 2012-03-01 MED ORDER — ALBUTEROL SULFATE (5 MG/ML) 0.5% IN NEBU: 2.5000 mg | INHALATION_SOLUTION | RESPIRATORY_TRACT | Status: DC | PRN

## 2012-03-01 MED ORDER — HYDROMORPHONE HCL PF 1 MG/ML IJ SOLN
1.0000 mg | Freq: Once | INTRAMUSCULAR | Status: AC
Start: 2012-03-01 — End: 2012-03-01
  Administered 2012-03-01: 1 mg via INTRAVENOUS
  Filled 2012-03-01: qty 1

## 2012-03-01 MED ORDER — HYDROMORPHONE HCL PF 1 MG/ML IJ SOLN
1.0000 mg | INTRAMUSCULAR | Status: DC | PRN
  Administered 2012-03-01: 1 mg via INTRAVENOUS
  Filled 2012-03-01: qty 1

## 2012-03-01 MED ORDER — WARFARIN SODIUM 10 MG PO TABS
10.0000 mg | ORAL_TABLET | Freq: Once | ORAL | Status: AC
  Administered 2012-03-01: 10 mg via ORAL
  Filled 2012-03-01: qty 1

## 2012-03-01 MED ORDER — FENTANYL CITRATE 0.05 MG/ML IJ SOLN
50.0000 ug | Freq: Once | INTRAMUSCULAR | Status: AC
  Administered 2012-03-01: 50 ug via INTRAVENOUS
  Filled 2012-03-01: qty 2

## 2012-03-01 MED ORDER — AZITHROMYCIN 250 MG PO TABS
250.0000 mg | ORAL_TABLET | Freq: Every day | ORAL | Status: DC
  Administered 2012-03-01 – 2012-03-02 (×2): 250 mg via ORAL
  Filled 2012-03-01 (×2): qty 1

## 2012-03-01 MED ORDER — MORPHINE SULFATE 2 MG/ML IJ SOLN
2.0000 mg | INTRAMUSCULAR | Status: DC | PRN
  Administered 2012-03-01: 2 mg via INTRAVENOUS
  Filled 2012-03-01: qty 1

## 2012-03-01 MED ORDER — WARFARIN VIDEO
Freq: Once | Status: AC
  Administered 2012-03-01: 1

## 2012-03-01 MED ORDER — IOHEXOL 350 MG/ML SOLN
80.0000 mL | Freq: Once | INTRAVENOUS | Status: AC | PRN
  Administered 2012-03-01: 80 mL via INTRAVENOUS

## 2012-03-01 MED ORDER — SODIUM CHLORIDE 0.9 % IJ SOLN
3.0000 mL | Freq: Two times a day (BID) | INTRAMUSCULAR | Status: DC
  Administered 2012-03-01 – 2012-03-03 (×5): 3 mL via INTRAVENOUS

## 2012-03-01 MED ORDER — CO Q 10 100 MG PO CAPS: 1.0000 | ORAL_CAPSULE | Freq: Every day | ORAL | Status: DC

## 2012-03-01 MED ORDER — GUAIFENESIN-CODEINE 100-10 MG/5ML PO SOLN: 5.0000 mL | ORAL | Status: DC | PRN

## 2012-03-01 MED ORDER — ASPIRIN 81 MG PO CHEW
324.0000 mg | CHEWABLE_TABLET | Freq: Once | ORAL | Status: DC
  Filled 2012-03-01: qty 4

## 2012-03-01 MED ORDER — OXYCODONE-ACETAMINOPHEN 5-325 MG PO TABS
2.0000 | ORAL_TABLET | ORAL | Status: DC | PRN
  Administered 2012-03-01 (×2): 2 via ORAL
  Filled 2012-03-01 (×2): qty 2

## 2012-03-01 MED ORDER — HEPARIN SODIUM (PORCINE) 5000 UNIT/ML IJ SOLN: 60.0000 [IU]/kg | Freq: Once | INTRAMUSCULAR | Status: DC

## 2012-03-01 MED ORDER — COUMADIN BOOK
Freq: Once | Status: AC
  Administered 2012-03-01: 1
  Filled 2012-03-01: qty 1

## 2012-03-01 MED ORDER — HEPARIN BOLUS VIA INFUSION
5000.0000 [IU] | Freq: Once | INTRAVENOUS | Status: AC
  Administered 2012-03-01: 5000 [IU] via INTRAVENOUS

## 2012-03-01 MED ORDER — HEPARIN BOLUS VIA INFUSION
3000.0000 [IU] | Freq: Once | INTRAVENOUS | Status: AC
  Administered 2012-03-01: 3000 [IU] via INTRAVENOUS
  Filled 2012-03-01: qty 3000

## 2012-03-01 MED ORDER — WARFARIN - PHARMACIST DOSING INPATIENT
Freq: Every day | Status: DC
  Administered 2012-03-01: 18:00:00

## 2012-03-01 MED ORDER — HEPARIN (PORCINE) IN NACL 100-0.45 UNIT/ML-% IJ SOLN
2700.0000 [IU]/h | INTRAMUSCULAR | Status: AC
  Administered 2012-03-01: 1600 [IU]/h via INTRAVENOUS
  Administered 2012-03-01: 2000 [IU]/h via INTRAVENOUS
  Administered 2012-03-02: 2700 [IU]/h via INTRAVENOUS
  Administered 2012-03-02: 2250 [IU]/h via INTRAVENOUS
  Administered 2012-03-03: 2700 [IU]/h via INTRAVENOUS
  Filled 2012-03-01 (×6): qty 250

## 2012-03-01 NOTE — Progress Notes (Signed)
VASCULAR LAB PRELIMINARY  PRELIMINARY  PRELIMINARY  PRELIMINARY  Bilateral lower extremity venous duplex  completed.    Preliminary report:  Right:  No evidence of DVT, superficial thrombosis, or Baker's cyst.  Left:  Acute DVT noted in the tibial peroneal trunk.  No evidence of superficial thrombosis.  No Baker's cyst.  Wayne Arnold, RVT 03/01/2012, 5:14 PM

## 2012-03-01 NOTE — Progress Notes (Signed)
Utilization Review Completed.Arda Keadle T12/12/2011   

## 2012-03-01 NOTE — ED Provider Notes (Signed)
History     CSN: [id number]  Arrival date & time 03/01/12  4098   First MD Initiated Contact with Patient 03/01/12 0435      Chief Complaint  Patient presents with  . Shortness of Breath  . Cough    (Consider location/radiation/quality/duration/timing/severity/associated sxs/prior treatment) Patient is a 61 y.o. male presenting with cough and shortness of breath. The history is provided by the patient and the spouse.  Cough This is a new problem. The current episode started more than 2 days ago. The problem occurs constantly. The problem has not changed since onset.The cough is productive of blood-tinged sputum. There has been no fever. Associated symptoms include chest pain, chills and shortness of breath. Pertinent negatives include no sore throat and no wheezing. He has tried an opioid (and a z pak) for the symptoms. The treatment provided no relief. He is not a smoker. His past medical history does not include COPD or asthma.  Shortness of Breath  The current episode started 5 to 7 days ago. The onset was sudden. The problem occurs continuously. The problem has been gradually worsening. The problem is moderate. Nothing relieves the symptoms. Nothing aggravates the symptoms. Associated symptoms include chest pain, cough and shortness of breath. Pertinent negatives include no fever, no sore throat and no wheezing. There was no intake of a foreign body. He has not inhaled smoke recently. His past medical history does not include asthma. He has been behaving normally. Recently, medical care has been given at another facility. Services received include medications given and tests performed.  Now also having 5 hours of sharp right sided CP  Past Medical History  Diagnosis Date  . Pneumonia     History reviewed. No pertinent past surgical history.  History reviewed. No pertinent family history.  History  Substance Use Topics  . Smoking status: Never Smoker   . Smokeless tobacco: Not  on file  . Alcohol Use: Yes      Review of Systems  Constitutional: Positive for chills. Negative for fever.  HENT: Negative for sore throat, neck pain and neck stiffness.   Respiratory: Positive for cough and shortness of breath. Negative for wheezing.   Cardiovascular: Positive for chest pain. Negative for leg swelling.  Gastrointestinal: Negative for nausea, vomiting and abdominal pain.  All other systems reviewed and are negative.    Allergies  Codeine and Demerol  Home Medications   Current Outpatient Rx  Name  Route  Sig  Dispense  Refill  . AZITHROMYCIN 250 MG PO TABS   Oral   Take 1 tablet (250 mg total) by mouth daily. Take first 2 tablets together, then 1 every day until finished.   6 tablet   0   . OXYCODONE-ACETAMINOPHEN 5-325 MG PO TABS   Oral   Take 2 tablets by mouth every 4 (four) hours as needed for pain.   16 tablet   0     BP 129/75  Pulse 102  Temp 98.4 F (36.9 C) (Oral)  Resp 26  SpO2 93%  Physical Exam  Constitutional: He is oriented to person, place, and time. He appears well-developed and well-nourished.  HENT:  Head: Normocephalic and atraumatic.  Mouth/Throat: Oropharynx is clear and moist.  Eyes: Conjunctivae normal are normal. Pupils are equal, round, and reactive to light.  Neck: Normal range of motion. Neck supple.  Cardiovascular: Regular rhythm and intact distal pulses.  Tachycardia present.   Pulmonary/Chest: Effort normal. He has no wheezes. He has no rales.  diminished at right base  Abdominal: Soft. Bowel sounds are normal. There is no tenderness. There is no rebound and no guarding.  Musculoskeletal: Normal range of motion.  Neurological: He is alert and oriented to person, place, and time.  Skin: Skin is warm and dry. He is not diaphoretic.  Psychiatric: He has a normal mood and affect.    ED Course  Procedures (including critical care time)  Labs Reviewed  CBC WITH DIFFERENTIAL - Abnormal; Notable for the  following:    WBC 14.2 (*)     Neutrophils Relative 78 (*)     Neutro Abs 11.1 (*)     Lymphocytes Relative 11 (*)     Monocytes Absolute 1.4 (*)     All other components within normal limits  COMPREHENSIVE METABOLIC PANEL - Abnormal; Notable for the following:    Glucose, Bld 131 (*)     Albumin 3.3 (*)     All other components within normal limits  TROPONIN I   Dg Chest 2 View  03/01/2012  *RADIOLOGY REPORT*  Clinical Data: Mid right chest pain.  CHEST - 2 VIEW  Comparison: 02/28/2012  Findings: The normal heart size and pulmonary vascularity. Peribronchial thickening with increased perihilar markings since previous study likely to represent bronchitis.  There is a developing small pleural effusion versus pleural thickening.  Early pneumonia is not excluded.  No focal consolidation.  No pneumothorax.  Mediastinal contours appear intact.  Rib fractures seen on previous rib views are not demonstrated on today's study.  IMPRESSION: Peribronchial thickening with increased perihilar markings most suggestive of bronchitis.  Early pneumonia not excluded. Developing small right pleural effusion or pleural thickening.   Original Report Authenticated By: Burman Nieves, M.D.    Dg Ribs Unilateral W/chest Right  02/28/2012  *RADIOLOGY REPORT*  Clinical Data: Flu like symptoms.  Coughing spell and injured right posterior ribs.  RIGHT RIBS AND CHEST - 3+ VIEW  Comparison: Chest radiograph dated 06/20/2010  Findings: There are vague patchy densities in the right perihilar region that could represent subtle airspace disease and infection. No evidence for a pneumothorax.  Heart and mediastinum are within normal limits.  The patient has pedicle screw fixation in the lumbar spine. There may be subtle cortical irregularity involving the right seventh rib and eighth ribs.  IMPRESSION: Subtle patchy densities in the right lung could represent infection.  Mild cortical irregularity involving the right seventh and  eighth ribs.  These could represent minimally-displaced fractures.   Original Report Authenticated By: Richarda Overlie, M.D.      No diagnosis found.    MDM  521 case d/w Dr. Antoine Poche of cardiology who reviewed EKG.  No signs of posterior MI diffuse ST depression will need cardiac enzymes cycled.   544 am called Dr. Andria Meuse to discuss CT.  Case d/w Dr. Andria Meuse of radiology per my read on CT no rib fx B PE with infarct.  Dr. Andria Meuse concurs  Consult to pharmacy regarding heparin orders 5000 bolus     MDM Reviewed: previous chart, nursing note and vitals Interpretation: labs, ECG, x-ray and CT scan Consults: admitting MD and cardiology      Date: 03/01/2012  Rate: 101  Rhythm: sinus tachycardia  QRS Axis: normal  Intervals: normal  ST/T Wave abnormalities: ST depressions inferiorly, ST depressions anteriorly, ST depressions laterally and ST depressions diffusely  Conduction Disutrbances:none and left bundle branch block  Narrative Interpretation:   Old EKG Reviewed: changes noted  CRITICAL CARE Performed by: Jasmine Awe  Total critical care time: 120 minutes  Critical care time was exclusive of separately billable procedures and treating other patients.  Critical care was necessary to treat or prevent imminent or life-threatening deterioration.  Critical care was time spent personally by me on the following activities: development of treatment plan with patient and/or surrogate as well as nursing, discussions with consultants, evaluation of patient's response to treatment, examination of patient, obtaining history from patient or surrogate, ordering and performing treatments and interventions, ordering and review of laboratory studies, ordering and review of radiographic studies, pulse oximetry and re-evaluation of patient's condition.   Jasmine Awe, MD 03/01/12 [phone]

## 2012-03-01 NOTE — ED Notes (Signed)
Pt return from XR, IV site unremarkable. PO 89-90 on room air, pt placed on O2@2L  via N/C.

## 2012-03-01 NOTE — Progress Notes (Signed)
PHARMACIST - PHYSICIAN ORDER COMMUNICATION  CONCERNING: P&T Medication Policy on Herbal Medications  DESCRIPTION:  This patient's order for:  Coenzyme Q10 has been noted.  This product(s) is classified as an "herbal" or natural product. Due to a lack of definitive safety studies or FDA approval, nonstandard manufacturing practices, plus the potential risk of unknown drug-drug interactions while on inpatient medications, the Pharmacy and Therapeutics Committee does not permit the use of "herbal" or natural products of this type within [facility].   ACTION TAKEN: The pharmacy department is unable to verify this order at this time and your patient has been informed of this safety policy. Please reevaluate patient's clinical condition at discharge and address if the herbal or natural product(s) should be resumed at that time.  Emmitt Matthews, PharmD, BCPS  

## 2012-03-01 NOTE — Progress Notes (Signed)
  Echocardiogram 2D Echocardiogram has been performed.  Ehren Berisha FRANCES 03/01/2012, 11:51 AM

## 2012-03-01 NOTE — ED Notes (Signed)
Angelia RN [facility] sts she will give report to receiving nurse due to acuity and volume of pts in this dept.

## 2012-03-01 NOTE — Progress Notes (Signed)
Patient positive for DVT in Left Tibial area via venous doppler scan. MD notified. No orders received. Vital signs stable, patient comfortable. Will continue to monitor.

## 2012-03-01 NOTE — ED Notes (Signed)
Pt seen here on Sunday and dx'd with pneumonia and broken ribs, pt states he has been taking meds but no improvement.

## 2012-03-01 NOTE — H&P (Signed)
[facility] History and Physical  Lopez Dentinger WNU:[id number] DOB: [birth year] DOA: 03/01/2012  Referring physician: [facility] PCP: Girard Cooter, MD  Chief Complaint:   Shortness of Breath and pleuritic chest pain since 2 days  HPI:  For pleasant male with no significant past medical history presented at [facility] shortness of breath and right-sided pleuritic chest pain for past 2 days. 2 days back he experienced back pain and cough with shortness of breath and went to the ED where he had a chest x-ray which suggested possible pneumonia and was discharged home on Z-Pak. However he continued to have shortness of breath on exertion and started and right middle back pain stand related to his right side of the chest, 10 out of 10 in intensity and without any fevers or chills. This morning he also had 1 episode of  scanty hemoptysis. He then presented back to the ED where he had a CT angiogram the chest done showing acute PE bilateral central and segmental branches and possible infarct or lung bases. Patient was given IV was heparin and placed on heparin drip. EKG done showed ST depression in inferior leads. Cardiology was consulted from the ED recommended in serial troponins and monitor. History off having lower back surgery for spinal fusion about 18 months back and shortly thereafter developed a DVT left lower extremity and was treated for 6 months. He informs having a Doppler study done after treatment and did not show any further clots. He denies history of cancer, weight loss, cough or hemoptysis in the past. He denies any recent long travel except for 2 hours flight to, back. She denies any trauma to the legs. He informs being fairly active and exercises on the treadmill regularly. Denies headache, dizziness, palpitations, abdominal pain, bowel or urinary symptoms except for noticing dark urine this morning. Denies any leg swellings. On my  evaluation patient is  comfortable except for right-sided pleuritic chest pain mainly with cough and movement.    Review of Systems:  Constitutional: Denies fever, chills, diaphoresis, appetite change and fatigue.  HEENT: Denies photophobia, eye pain, redness, hearing loss, ear pain, congestion, sore throat, rhinorrhea, sneezing, mouth sores, trouble swallowing, neck pain, neck stiffness and tinnitus.   Respiratory:  SOB, DOE, cough, mild hemoptysis,  Denies chest tightness,  and wheezing.   Cardiovascular: Pleuritic chest pain,  denies palpitations and leg swelling.  Gastrointestinal: Denies nausea, vomiting, abdominal pain, diarrhea, constipation, blood in stool and abdominal distention.  Genitourinary: Denies dysuria, urgency, frequency, hematuria, flank pain and difficulty urinating.  Musculoskeletal: Denies myalgias, back pain, joint swelling, arthralgias and gait problem.  Skin: Denies pallor, rash and wound.  Neurological: Denies dizziness, seizures, syncope, weakness, light-headedness, numbness and headaches.  Hematological: Denies adenopathy. Easy bruising, personal or family bleeding history  Psychiatric/Behavioral: Denies suicidal ideation, mood changes, confusion, nervousness, sleep disturbance and agitation   Past Medical History  Diagnosis Date  . Pneumonia    History reviewed. No pertinent past surgical history. Social History:  reports that he has never smoked. He does not have any smokeless tobacco history on file. He reports that he drinks alcohol. He reports that he does not use illicit drugs.  Allergies  Allergen Reactions  . Codeine     Stomach upset  . Demerol (Meperidine)     Stomach upset    History reviewed. No pertinent family history.  Prior to Admission medications   Medication Sig Start Date End Date Taking? Authorizing Provider  aspirin 500 MG  EC tablet Take 1,000 mg by mouth every 6 (six) hours as needed. Chest pain   Yes Historical Provider, MD  azithromycin (ZITHROMAX)  250 MG tablet Take 1 tablet (250 mg total) by mouth daily. Take first 2 tablets together, then 1 every day until finished. 02/28/12  Yes Elson Areas, PA  b complex vitamins tablet Take 1 tablet by mouth every other day.   Yes Historical Provider, MD  Calcium Carbonate-Vitamin D (CALCIUM 500 + D PO) Take 1 tablet by mouth every other day.   Yes Historical Provider, MD  Coenzyme Q10 (CO Q 10) 100 MG CAPS Take 1 tablet by mouth daily.   Yes Historical Provider, MD  oxyCODONE-acetaminophen (PERCOCET/ROXICET) 5-325 MG per tablet Take 2 tablets by mouth every 4 (four) hours as needed for pain. 02/28/12  Yes Elson Areas, PA    Physical Exam:  Filed Vitals:   03/01/12 0511 03/01/12 0514 03/01/12 0601 03/01/12 0631  BP:   106/61 109/64  Pulse:   95   Temp:      TempSrc:      Resp: 26  18 22   Height:  6\' 4"  (1.93 m)    Weight:  95.255 kg (210 lb)    SpO2: 89% 93% 95% 95%    Constitutional: Vital signs reviewed.  Patient is a well-developed and well-nourished in no acute distress and cooperative with exam. Alert and oriented x3.  Head: Normocephalic and atraumatic Ear: TM normal bilaterally Mouth: no erythema or exudates, MMM Eyes: PERRL, EOMI, conjunctivae normal, No scleral icterus.  Neck: Supple, Trachea midline normal ROM, No JVD, mass, thyromegaly, or carotid bruit present.  Cardiovascular: RRR, S1 normal, S2 normal, no MRG, pulses symmetric and intact bilaterally Pulmonary/Chest: Bilateral equal air entry, no wheezes, rales, or rhonchi, fine crackles over lung bases Abdominal: Soft. Non-tender, non-distended, bowel sounds are normal, no masses, organomegaly, or guarding present.  GU: no CVA tenderness Musculoskeletal: No joint deformities, erythema, or stiffness, ROM full and no nontender Ext: no edema and no cyanosis, pulses palpable bilaterally (DP and PT) Hematology: no cervical, inginal, or axillary adenopathy.  Neurological: A&O x3, Strenght is normal and symmetric bilaterally,  cranial nerve II-XII are grossly intact, no focal motor deficit, sensory intact to light touch bilaterally.  Skin: Warm, dry and intact. No rash, cyanosis, or clubbing.  Psychiatric: Normal mood and affect. speech and behavior is normal. Judgment and thought content normal. Cognition and memory are normal.   Labs on Admission:  Basic Metabolic Panel:  Lab 03/01/12 1610  NA 136  K 3.8  CL 98  CO2 24  GLUCOSE 131*  BUN 14  CREATININE 0.90  CALCIUM 9.4  MG --  PHOS --   Liver Function Tests:  Lab 03/01/12 0447  AST 15  ALT 13  ALKPHOS 65  BILITOT 0.9  PROT 7.6  ALBUMIN 3.3*   No results found for this basename: LIPASE:5,AMYLASE:5 in the last 168 hours No results found for this basename: AMMONIA:5 in the last 168 hours CBC:  Lab 03/01/12 0447  WBC 14.2*  NEUTROABS 11.1*  HGB 15.0  HCT 42.0  MCV 92.3  PLT 227   Cardiac Enzymes:  Lab 03/01/12 0447  CKTOTAL --  CKMB --  CKMBINDEX --  TROPONINI <0.30   BNP: No components found with this basename: POCBNP:5 CBG: No results found for this basename: GLUCAP:5 in the last 168 hours  Radiological Exams on Admission: Dg Chest 2 View  03/01/2012  *RADIOLOGY REPORT*  Clinical Data: Mid right  chest pain.  CHEST - 2 VIEW  Comparison: 02/28/2012  Findings: The normal heart size and pulmonary vascularity. Peribronchial thickening with increased perihilar markings since previous study likely to represent bronchitis.  There is a developing small pleural effusion versus pleural thickening.  Early pneumonia is not excluded.  No focal consolidation.  No pneumothorax.  Mediastinal contours appear intact.  Rib fractures seen on previous rib views are not demonstrated on today's study.  IMPRESSION: Peribronchial thickening with increased perihilar markings most suggestive of bronchitis.  Early pneumonia not excluded. Developing small right pleural effusion or pleural thickening.   Original Report Authenticated By: Burman Nieves, M.D.     Dg Ribs Unilateral W/chest Right  02/28/2012  *RADIOLOGY REPORT*  Clinical Data: Flu like symptoms.  Coughing spell and injured right posterior ribs.  RIGHT RIBS AND CHEST - 3+ VIEW  Comparison: Chest radiograph dated 06/20/2010  Findings: There are vague patchy densities in the right perihilar region that could represent subtle airspace disease and infection. No evidence for a pneumothorax.  Heart and mediastinum are within normal limits.  The patient has pedicle screw fixation in the lumbar spine. There may be subtle cortical irregularity involving the right seventh rib and eighth ribs.  IMPRESSION: Subtle patchy densities in the right lung could represent infection.  Mild cortical irregularity involving the right seventh and eighth ribs.  These could represent minimally-displaced fractures.   Original Report Authenticated By: Richarda Overlie, M.D.    Ct Angio Chest Pe W/cm &/or Wo Cm  03/01/2012  *RADIOLOGY REPORT*  Clinical Data: Chest pain and shortness of breath.  CT ANGIOGRAPHY CHEST  Technique:  Multidetector CT imaging of the chest using the standard protocol during bolus administration of intravenous contrast. Multiplanar reconstructed images including MIPs were obtained and reviewed to evaluate the vascular anatomy.  Contrast: 80mL OMNIPAQUE IOHEXOL 350 MG/ML SOLN  Comparison: None.  Findings: Technically adequate study with good opacification of the central and segmental pulmonary arteries.  There are large central filling defects in the main pulmonary arteries bilaterally extending into multiple lobar and segmental upper and lower lobe branches.  Changes are consistent with significant pulmonary embolus with prominent clot burden.  Normal heart size.  Normal caliber thoracic aorta.  No significant lymphadenopathy in the chest.  Esophagus is decompressed. Atelectasis or infiltration in both lung bases.  Changes may be related to infarct. Small bilateral pleural effusions.  No pneumothorax.  Visualized  upper abdominal organs are grossly unremarkable except for small hepatic cysts.  Small esophageal hiatal hernia.  The degenerative changes in the thoracic spine.  IMPRESSION: Positive study for multiple bilateral central and segmental pulmonary emboli.  Infiltrates in the lung bases could represent pulmonary infarcts.  Small bilateral pleural effusions.  Results were discussed by telephone with Dr. Nicanor Alcon at 0545 hours on 03/01/2012.   Original Report Authenticated By: Burman Nieves, M.D.     EKG: NSR with ST depression in inferior leads  Assessment/Plan Active Problems:  Pulmonary embolism and infarction Patient has bilateral PE with significant clot burden CT scan. He is hemodynamically stable at this time. -Will admit to step down for closer monitoring. Patient was  given IV heparin bolus and started on IV heparin drip which we will continue at this time. dose adjusted per pharmacy. Will start him on Coumadin and dose will be adjusted by pharmacy as well. -no Clear cause of his underlying PE except for a recent travel. Patient has had a DVT the left lower extremity 18 months back and treated but  that was most likely provoked by  recent surgery he had . -Check a Doppler of lower extremities for any DVT.  -Given EKG changes we will  cycle his enzymes and EKGs. We'll also check a 2-D echo to evaluate for RV strain. -Monitor closely for any hemodynamic instability. We will get pulmonary evaluation if needed.Depending upon the results we will determine the duration of  his treatment. -will give guaifenesin  with codeine for her cough with chest pain and when necessary morphine. He has been taking Zithromax for bronchitis except for for past 2 days which I will continue. -Monitor H&H. - i will Check a hypercoagulable panel patient is alert and heparin drip and will interfere with some of these tests.  Diet: regular  Code Status:full  Family Communication: wife at bedside Disposition Plan: home  once stable  Eddie North [facility] Pager [phone]  If 7PM-7AM, please contact night-coverage [email or web address] Password [email or web address] 03/01/2012, 9:59 AM    Spent on admission: 70 minutes

## 2012-03-01 NOTE — ED Notes (Signed)
SR on monitor, IV site x 2 unremarkable, heparin gtt started per MD order. PO 95 on O2@2l  via N/C. Pt c/o pain, MD made aware.

## 2012-03-01 NOTE — ED Notes (Signed)
Patient transported to CT 

## 2012-03-01 NOTE — Progress Notes (Signed)
ANTICOAGULATION CONSULT NOTE - Initial Consult  Pharmacy Consult for heparin Indication: pulmonary embolus  Allergies  Allergen Reactions  . Codeine   . Demerol (Meperidine)     Patient Measurements: Height: 6\' 4"  (193 cm) Weight: 210 lb (95.255 kg) IBW/kg (Calculated) : 86.8   Vital Signs: Temp: 98.4 F (36.9 C) (12/10 0439) Temp src: Oral (12/10 0439) BP: 129/75 mmHg (12/10 0439) Pulse Rate: 102  (12/10 0439)  Labs:  Basename 03/01/12 0447  HGB 15.0  HCT 42.0  PLT 227  APTT --  LABPROT --  INR --  HEPARINUNFRC --  CREATININE 0.90  CKTOTAL --  CKMB --  TROPONINI <0.30    Estimated Creatinine Clearance: 107.2 ml/min (by C-G formula based on Cr of 0.9).   Medical History: Past Medical History  Diagnosis Date  . Pneumonia     Assessment: 61yo male had been seen at [facility] on Sunday and dx'd with PNA, now c/o no improvement despite taking meds as rx'd, CT shows hemodynamically significant multiple bilateral central and segmental PE with possible pulmonary infarcts, to begin heparin.  Goal of Therapy:  Heparin level 0.3-0.7 units/ml Monitor platelets by anticoagulation protocol: Yes   Plan:  Will give heparin 5000 units IV bolus x1 followed by gtt at 1600 units/hr and monitor heparin levels and CBC.  Colleen Can PharmD BCPS 03/01/2012,5:58 AM

## 2012-03-01 NOTE — Progress Notes (Signed)
ANTICOAGULATION CONSULT NOTE   Pharmacy Consult for Heparin  Indication: pulmonary embolus  Allergies  Allergen Reactions  . Codeine     Stomach upset  . Demerol (Meperidine)     Stomach upset   Labs:  Basename 03/01/12 2029 03/01/12 1602 03/01/12 1247 03/01/12 1038 03/01/12 0447  HGB 12.9* -- -- -- 15.0  HCT 37.4* -- -- -- 42.0  PLT 193 -- -- -- 227  APTT -- -- -- -- --  LABPROT -- -- -- -- --  INR -- -- -- -- --  HEPARINUNFRC 0.27* -- 0.14* -- --  CREATININE -- -- -- -- 0.90  CKTOTAL -- -- -- -- --  CKMB -- -- -- -- --  TROPONINI -- <0.30 -- <0.30 <0.30    Estimated Creatinine Clearance: 107.2 ml/min (by C-G formula based on Cr of 0.9).  Assessment: 61yo male had been seen at [facility] on Sunday and dx'd with PNA, now c/o no improvement despite taking meds as rx'd, CT shows hemodynamically significant multiple bilateral central and segmental PE with possible pulmonary infarcts, started on heparin 12/10.    Heparin level = 0.27 (slightly sub-therapeutic)  Goal of Therapy:  Heparin level 0.3-0.7 units/ml Monitor platelets by anticoagulation protocol: Yes   Plan:  1) Increase heparin to 2250 units / hr 2) Follow up AM heparin level, CBC  Okey Regal, PharmD [phone]  03/01/2012,9:12 PM

## 2012-03-01 NOTE — ED Notes (Signed)
Patient transported to CT, SR up x2.

## 2012-03-01 NOTE — Progress Notes (Signed)
ANTICOAGULATION CONSULT NOTE   Pharmacy Consult for heparin and warfarin Indication: pulmonary embolus  Allergies  Allergen Reactions  . Codeine     Stomach upset  . Demerol (Meperidine)     Stomach upset    Patient Measurements: Height: 6\' 4"  (193 cm) Weight: 210 lb (95.255 kg) IBW/kg (Calculated) : 86.8   Vital Signs: Temp: 98.5 F (36.9 C) (12/10 0915) Temp src: Oral (12/10 0915) BP: 112/69 mmHg (12/10 1230) Pulse Rate: 90  (12/10 1230)  Labs:  Basename 03/01/12 1247 03/01/12 1038 03/01/12 0447  HGB -- -- 15.0  HCT -- -- 42.0  PLT -- -- 227  APTT -- -- --  LABPROT -- -- --  INR -- -- --  HEPARINUNFRC 0.14* -- --  CREATININE -- -- 0.90  CKTOTAL -- -- --  CKMB -- -- --  TROPONINI -- <0.30 <0.30    Estimated Creatinine Clearance: 107.2 ml/min (by C-G formula based on Cr of 0.9).   Medical History: Past Medical History  Diagnosis Date  . PONV (postoperative nausea and vomiting)   . GERD (gastroesophageal reflux disease)     Assessment: 61yo male had been seen at [facility] on Sunday and dx'd with PNA, now c/o no improvement despite taking meds as rx'd, CT shows hemodynamically significant multiple bilateral central and segmental PE with possible pulmonary infarcts, started on heparin 12/10.  His first heparin level is subtherapeutic at 0.14.  Warfarin to start today as well.  Goal of Therapy:  Heparin level 0.3-0.7 units/ml Monitor platelets by anticoagulation protocol: Yes   Plan:  Will give heparin 3000  units IV bolus x1 and increase heparin drip to 2000 units/hr.  Heparin level in 6 hours.  Daily heparin level, cbc. Warfarin 10mg  po x 1 dose Daily protimes  Farrel Gobble Garth Bigness PharmD BCPS 03/01/2012,2:14 PM

## 2012-03-01 NOTE — Progress Notes (Signed)
Pt transferred here via EMS at 0915hrs.   VSS on arrival, heparin gtt infusing without problem.  Oriented to unit and plan of care for shift. Pt and wife verbalized understanding.  Admission history completed. Dr. Gonzella Lex here to see pt.  Pt now c/o chest pain that increases with deep breaths, unrelieved by morphine.  Dr. Gonzella Lex paged, awaiting call back.

## 2012-03-02 DIAGNOSIS — R0781 Pleurodynia: Secondary | ICD-10-CM | POA: Diagnosis present

## 2012-03-02 DIAGNOSIS — J9601 Acute respiratory failure with hypoxia: Secondary | ICD-10-CM | POA: Diagnosis present

## 2012-03-02 DIAGNOSIS — J9600 Acute respiratory failure, unspecified whether with hypoxia or hypercapnia: Secondary | ICD-10-CM

## 2012-03-02 DIAGNOSIS — I82409 Acute embolism and thrombosis of unspecified deep veins of unspecified lower extremity: Secondary | ICD-10-CM | POA: Diagnosis present

## 2012-03-02 DIAGNOSIS — R9431 Abnormal electrocardiogram [ECG] [EKG]: Secondary | ICD-10-CM

## 2012-03-02 LAB — LUPUS ANTICOAGULANT PANEL
DRVVT: 39.3 secs (ref <42.9)
PTT Lupus Anticoagulant: 80.4 secs — ABNORMAL HIGH (ref 28.0–43.0)
PTTLA Confirmation: 22.5 secs — ABNORMAL HIGH (ref <8.0)

## 2012-03-02 LAB — CBC
Hemoglobin: 13 g/dL (ref 13.0–17.0)
MCV: 93.1 fL (ref 78.0–100.0)
Platelets: 215 10*3/uL (ref 150–400)
RBC: 4.04 MIL/uL — ABNORMAL LOW (ref 4.22–5.81)
WBC: 11.4 10*3/uL — ABNORMAL HIGH (ref 4.0–10.5)

## 2012-03-02 LAB — HEPARIN LEVEL (UNFRACTIONATED)
Heparin Unfractionated: 0.28 IU/mL — ABNORMAL LOW (ref 0.30–0.70)
Heparin Unfractionated: 0.29 IU/mL — ABNORMAL LOW (ref 0.30–0.70)
Heparin Unfractionated: 0.36 IU/mL (ref 0.30–0.70)

## 2012-03-02 LAB — BASIC METABOLIC PANEL
Calcium: 9.1 mg/dL (ref 8.4–10.5)
Chloride: 99 mEq/L (ref 96–112)

## 2012-03-02 LAB — FACTOR 5 LEIDEN

## 2012-03-02 LAB — PROTIME-INR: Prothrombin Time: 15.5 seconds — ABNORMAL HIGH (ref 11.6–15.2)

## 2012-03-02 MED ORDER — IBUPROFEN 800 MG PO TABS
800.0000 mg | ORAL_TABLET | Freq: Three times a day (TID) | ORAL | Status: DC | PRN
Start: 2012-03-02 — End: 2012-03-03
  Filled 2012-03-02: qty 1

## 2012-03-02 MED ORDER — BIOTENE DRY MOUTH MT LIQD
15.0000 mL | Freq: Two times a day (BID) | OROMUCOSAL | Status: DC
  Administered 2012-03-02 – 2012-03-03 (×3): 15 mL via OROMUCOSAL

## 2012-03-02 MED ORDER — ALBUTEROL SULFATE (5 MG/ML) 0.5% IN NEBU: 2.5000 mg | INHALATION_SOLUTION | RESPIRATORY_TRACT | Status: DC | PRN

## 2012-03-02 MED ORDER — RIVAROXABAN 15 MG PO TABS
15.0000 mg | ORAL_TABLET | Freq: Two times a day (BID) | ORAL | Status: DC
  Administered 2012-03-03: 15 mg via ORAL
  Filled 2012-03-02 (×2): qty 1

## 2012-03-02 MED ORDER — WARFARIN SODIUM 7.5 MG PO TABS
7.5000 mg | ORAL_TABLET | Freq: Once | ORAL | Status: DC
  Filled 2012-03-02: qty 1

## 2012-03-02 NOTE — Consult Note (Addendum)
[facility]       CONSULTATION NOTE   Reason for Consult: PE, ST segment depression  Requesting Physician: Dr. Sharon Seller  Cardiologist: None  HPI: This is a 61 y.o. male with no significant past medical history presented at [facility] shortness of breath and right-sided pleuritic chest pain for past 2 days. 2 days back he experienced back pain and cough with shortness of breath and went to the ED where he had a chest x-ray which suggested possible pneumonia and was discharged home on Z-Pak. However he continued to have shortness of breath on exertion and started and right middle back pain stand related to his right side of the chest, 10 out of 10 in intensity and without any fevers or chills. This morning he also had 1 episode of scanty hemoptysis. He then presented back to the ED where he had a CT angiogram the chest done showing acute PE bilateral central and segmental branches and possible infarct or lung bases. Patient was given IV was heparin and placed on heparin drip. EKG done showed ST depression in inferior leads. Cardiology was consulted from the ED recommended in serial troponins and monitor, but no formal consult was obtained. We are asked to comment on ST segment changes.   PMHx:  Past Medical History  Diagnosis Date  . PONV (postoperative nausea and vomiting)   . GERD (gastroesophageal reflux disease)    Past Surgical History  Procedure Date  . Back surgery 2012    two spinal fusions  . Shoulder arthroscopy     FAMHx: History reviewed. No pertinent family history.  SOCHx:  reports that he has never smoked. He does not have any smokeless tobacco history on file. He reports that he drinks about 1.2 ounces of alcohol per week. He reports that he does not use illicit drugs.  ALLERGIES: Allergies  Allergen Reactions  . Codeine     Stomach upset  . Demerol (Meperidine)     Stomach upset    ROS: A comprehensive review of  systems was negative except for: Respiratory: positive for cough, dyspnea on exertion and hemoptysis Cardiovascular: positive for chest pain  HOME MEDICATIONS: Prescriptions prior to admission  Medication Sig Dispense Refill  . aspirin 500 MG EC tablet Take 1,000 mg by mouth every 6 (six) hours as needed. Chest pain      . azithromycin (ZITHROMAX) 250 MG tablet Take 1 tablet (250 mg total) by mouth daily. Take first 2 tablets together, then 1 every day until finished.  6 tablet  0  . b complex vitamins tablet Take 1 tablet by mouth every other day.      . Calcium Carbonate-Vitamin D (CALCIUM 500 + D PO) Take 1 tablet by mouth every other day.      . Coenzyme Q10 (CO Q 10) 100 MG CAPS Take 1 tablet by mouth daily.      Marland Kitchen oxyCODONE-acetaminophen (PERCOCET/ROXICET) 5-325 MG per tablet Take 2 tablets by mouth every 4 (four) hours as needed for pain.  16 tablet  0    HOSPITAL MEDICATIONS: Prior to Admission:  Prescriptions prior to admission  Medication Sig Dispense Refill  . aspirin 500 MG EC tablet Take 1,000 mg by mouth every 6 (six) hours as needed. Chest pain      . azithromycin (ZITHROMAX) 250 MG tablet Take 1 tablet (250 mg total) by mouth daily. Take first 2 tablets together, then 1 every day until finished.  6 tablet  0  . b complex vitamins tablet Take 1 tablet by mouth every other day.      . Calcium Carbonate-Vitamin D (CALCIUM 500 + D PO) Take 1 tablet by mouth every other day.      . Coenzyme Q10 (CO Q 10) 100 MG CAPS Take 1 tablet by mouth daily.      Marland Kitchen oxyCODONE-acetaminophen (PERCOCET/ROXICET) 5-325 MG per tablet Take 2 tablets by mouth every 4 (four) hours as needed for pain.  16 tablet  0    VITALS: Blood pressure 114/68, pulse 82, temperature 98.7 F (37.1 C), temperature source Oral, resp. rate 20, height 6\' 4"  (1.93 m), weight 95.2 kg (209 lb 14.1 oz), SpO2 98.00%.  PHYSICAL EXAM: General appearance: alert and no distress Neck: no adenopathy, no carotid bruit, no JVD,  supple, symmetrical, trachea midline and thyroid not enlarged, symmetric, no tenderness/mass/nodules Lungs: clear to auscultation bilaterally Heart: regular rate and rhythm, S1, S2 normal, no murmur, click, rub or gallop Abdomen: soft, non-tender; bowel sounds normal; no masses,  no organomegaly Extremities: extremities normal, atraumatic, no cyanosis or edema Pulses: 2+ and symmetric Skin: Skin color, texture, turgor normal. No rashes or lesions or multiple tattoos Neurologic: Grossly normal  LABS: Results for orders placed during the hospital encounter of 03/01/12 (from the past 48 hour(s))  CBC WITH DIFFERENTIAL     Status: Abnormal   Collection Time   03/01/12  4:47 AM      Component Value Range Comment   WBC 14.2 (*) 4.0 - 10.5 K/uL    RBC 4.55  4.22 - 5.81 MIL/uL    Hemoglobin 15.0  13.0 - 17.0 g/dL    HCT 16.1  09.6 - 04.5 %    MCV 92.3  78.0 - 100.0 fL    MCH 33.0  26.0 - 34.0 pg    MCHC 35.7  30.0 - 36.0 g/dL    RDW 40.9  81.1 - 91.4 %    Platelets 227  150 - 400 K/uL    Neutrophils Relative 78 (*) 43 - 77 %    Neutro Abs 11.1 (*) 1.7 - 7.7 K/uL    Lymphocytes Relative 11 (*) 12 - 46 %    Lymphs Abs 1.5  0.7 - 4.0 K/uL    Monocytes Relative 10  3 - 12 %    Monocytes Absolute 1.4 (*) 0.1 - 1.0 K/uL    Eosinophils Relative 1  0 - 5 %    Eosinophils Absolute 0.2  0.0 - 0.7 K/uL    Basophils Relative 0  0 - 1 %    Basophils Absolute 0.0  0.0 - 0.1 K/uL   COMPREHENSIVE METABOLIC PANEL     Status: Abnormal   Collection Time   03/01/12  4:47 AM      Component Value Range Comment   Sodium 136  135 - 145 mEq/L    Potassium 3.8  3.5 - 5.1 mEq/L    Chloride 98  96 - 112 mEq/L    CO2 24  19 - 32 mEq/L    Glucose, Bld 131 (*) 70 - 99 mg/dL    BUN 14  6 - 23 mg/dL    Creatinine, Ser 7.82  0.50 - 1.35 mg/dL    Calcium 9.4  8.4 - 95.6 mg/dL    Total Protein 7.6  6.0 - 8.3 g/dL    Albumin 3.3 (*) 3.5 - 5.2 g/dL    AST 15  0 - 37 U/L  ALT 13  0 - 53 U/L    Alkaline  Phosphatase 65  39 - 117 U/L    Total Bilirubin 0.9  0.3 - 1.2 mg/dL    GFR calc non Af Amer >90  >90 mL/min    GFR calc Af Amer >90  >90 mL/min   TROPONIN I     Status: Normal   Collection Time   03/01/12  4:47 AM      Component Value Range Comment   Troponin I <0.30  <0.30 ng/mL   MRSA PCR SCREENING     Status: Normal   Collection Time   03/01/12  9:12 AM      Component Value Range Comment   MRSA by PCR NEGATIVE  NEGATIVE   TROPONIN I     Status: Normal   Collection Time   03/01/12 10:38 AM      Component Value Range Comment   Troponin I <0.30  <0.30 ng/mL   ANTITHROMBIN III     Status: Normal   Collection Time   03/01/12 10:39 AM      Component Value Range Comment   AntiThromb III Func 85  75 - 120 %   PROTEIN C, TOTAL     Status: Abnormal   Collection Time   03/01/12 10:39 AM      Component Value Range Comment   Protein C, Total 68 (*) 72 - 160 %   PROTEIN S, TOTAL     Status: Normal   Collection Time   03/01/12 10:39 AM      Component Value Range Comment   Protein S Ag, Total 79  60 - 150 %   HOMOCYSTEINE     Status: Normal   Collection Time   03/01/12 10:39 AM      Component Value Range Comment   Homocysteine 10.7  4.0 - 15.4 umol/L   URINALYSIS, ROUTINE W REFLEX MICROSCOPIC     Status: Abnormal   Collection Time   03/01/12 12:15 PM      Component Value Range Comment   Color, Urine ORANGE (*) YELLOW BIOCHEMICALS MAY BE AFFECTED BY COLOR   APPearance CLEAR  CLEAR    Specific Gravity, Urine 1.015  1.005 - 1.030    pH 6.0  5.0 - 8.0    Glucose, UA NEGATIVE  NEGATIVE mg/dL    Hgb urine dipstick NEGATIVE  NEGATIVE    Bilirubin Urine SMALL (*) NEGATIVE    Ketones, ur 40 (*) NEGATIVE mg/dL    Protein, ur NEGATIVE  NEGATIVE mg/dL    Urobilinogen, UA 1.0  0.0 - 1.0 mg/dL    Nitrite NEGATIVE  NEGATIVE    Leukocytes, UA NEGATIVE  NEGATIVE MICROSCOPIC NOT DONE ON URINES WITH NEGATIVE PROTEIN, BLOOD, LEUKOCYTES, NITRITE, OR GLUCOSE <1000 mg/dL.  HEPARIN LEVEL  (UNFRACTIONATED)     Status: Abnormal   Collection Time   03/01/12 12:47 PM      Component Value Range Comment   Heparin Unfractionated 0.14 (*) 0.30 - 0.70 IU/mL   TROPONIN I     Status: Normal   Collection Time   03/01/12  4:02 PM      Component Value Range Comment   Troponin I <0.30  <0.30 ng/mL   TROPONIN I     Status: Normal   Collection Time   03/01/12  8:28 PM      Component Value Range Comment   Troponin I <0.30  <0.30 ng/mL   CBC     Status: Abnormal   Collection  Time   03/01/12  8:29 PM      Component Value Range Comment   WBC 11.7 (*) 4.0 - 10.5 K/uL    RBC 4.05 (*) 4.22 - 5.81 MIL/uL    Hemoglobin 12.9 (*) 13.0 - 17.0 g/dL    HCT 96.0 (*) 45.4 - 52.0 %    MCV 92.3  78.0 - 100.0 fL    MCH 31.9  26.0 - 34.0 pg    MCHC 34.5  30.0 - 36.0 g/dL    RDW 09.8  11.9 - 14.7 %    Platelets 193  150 - 400 K/uL   HEPARIN LEVEL (UNFRACTIONATED)     Status: Abnormal   Collection Time   03/01/12  8:29 PM      Component Value Range Comment   Heparin Unfractionated 0.27 (*) 0.30 - 0.70 IU/mL   BASIC METABOLIC PANEL     Status: Abnormal   Collection Time   03/02/12  5:20 AM      Component Value Range Comment   Sodium 135  135 - 145 mEq/L    Potassium 4.4  3.5 - 5.1 mEq/L    Chloride 99  96 - 112 mEq/L    CO2 26  19 - 32 mEq/L    Glucose, Bld 101 (*) 70 - 99 mg/dL    BUN 14  6 - 23 mg/dL    Creatinine, Ser 8.29  0.50 - 1.35 mg/dL    Calcium 9.1  8.4 - 56.2 mg/dL    GFR calc non Af Amer 90 (*) >90 mL/min    GFR calc Af Amer >90  >90 mL/min   CBC     Status: Abnormal   Collection Time   03/02/12  5:20 AM      Component Value Range Comment   WBC 11.4 (*) 4.0 - 10.5 K/uL    RBC 4.04 (*) 4.22 - 5.81 MIL/uL    Hemoglobin 13.0  13.0 - 17.0 g/dL    HCT 13.0 (*) 86.5 - 52.0 %    MCV 93.1  78.0 - 100.0 fL    MCH 32.2  26.0 - 34.0 pg    MCHC 34.6  30.0 - 36.0 g/dL    RDW 78.4  69.6 - 29.5 %    Platelets 215  150 - 400 K/uL   PROTIME-INR     Status: Abnormal   Collection Time    03/02/12  5:20 AM      Component Value Range Comment   Prothrombin Time 15.5 (*) 11.6 - 15.2 seconds    INR 1.25  0.00 - 1.49   HEPARIN LEVEL (UNFRACTIONATED)     Status: Abnormal   Collection Time   03/02/12  5:20 AM      Component Value Range Comment   Heparin Unfractionated 0.28 (*) 0.30 - 0.70 IU/mL   HEPARIN LEVEL (UNFRACTIONATED)     Status: Abnormal   Collection Time   03/02/12 12:10 PM      Component Value Range Comment   Heparin Unfractionated 0.29 (*) 0.30 - 0.70 IU/mL     IMAGING: Dg Chest 2 View  03/01/2012  *RADIOLOGY REPORT*  Clinical Data: Mid right chest pain.  CHEST - 2 VIEW  Comparison: 02/28/2012  Findings: The normal heart size and pulmonary vascularity. Peribronchial thickening with increased perihilar markings since previous study likely to represent bronchitis.  There is a developing small pleural effusion versus pleural thickening.  Early pneumonia is not excluded.  No focal consolidation.  No pneumothorax.  Mediastinal  contours appear intact.  Rib fractures seen on previous rib views are not demonstrated on today's study.  IMPRESSION: Peribronchial thickening with increased perihilar markings most suggestive of bronchitis.  Early pneumonia not excluded. Developing small right pleural effusion or pleural thickening.   Original Report Authenticated By: Burman Nieves, M.D.    Ct Angio Chest Pe W/cm &/or Wo Cm  03/01/2012  *RADIOLOGY REPORT*  Clinical Data: Chest pain and shortness of breath.  CT ANGIOGRAPHY CHEST  Technique:  Multidetector CT imaging of the chest using the standard protocol during bolus administration of intravenous contrast. Multiplanar reconstructed images including MIPs were obtained and reviewed to evaluate the vascular anatomy.  Contrast: 80mL OMNIPAQUE IOHEXOL 350 MG/ML SOLN  Comparison: None.  Findings: Technically adequate study with good opacification of the central and segmental pulmonary arteries.  There are large central filling defects  in the main pulmonary arteries bilaterally extending into multiple lobar and segmental upper and lower lobe branches.  Changes are consistent with significant pulmonary embolus with prominent clot burden.  Normal heart size.  Normal caliber thoracic aorta.  No significant lymphadenopathy in the chest.  Esophagus is decompressed. Atelectasis or infiltration in both lung bases.  Changes may be related to infarct. Small bilateral pleural effusions.  No pneumothorax.  Visualized upper abdominal organs are grossly unremarkable except for small hepatic cysts.  Small esophageal hiatal hernia.  The degenerative changes in the thoracic spine.  IMPRESSION: Positive study for multiple bilateral central and segmental pulmonary emboli.  Infiltrates in the lung bases could represent pulmonary infarcts.  Small bilateral pleural effusions.  Results were discussed by telephone with Dr. Nicanor Alcon at 0545 hours on 03/01/2012.   Original Report Authenticated By: Burman Nieves, M.D.    ECHO: EF 55-60%, RVSP 44 mmHg, septal bounce but no RV strain, no WMA's.  IMPRESSION: 1. Multisegment pulmonary embolic 2. Recurrent LLE DVT, history of GSV ablation and sclerotherapy 3. ST depression in the setting of acute myocardial strain  RECOMMENDATION: 1. Mr. Sauber has little medical history and few cardiac risk factors. He did have anterolateral ST segment depression on admission which has resolved and I suspect this is due to RV strain from acute PE.  ST depression does not lead localize to a vascular distribution so it does not have to be inferior. Troponins are negative.  It may be reasonable to undergo a lexiscan nuclear stress test to evaluate as an outpatient in a month or so after the PE has resolved. I have given him my card and the office will call him to arrange an appointment. No further ischemic testing is warranted at this time.  He will likely need lifelong anticoagulation.  Thanks for the consult. Call with  questions.  Time Spent Directly with Patient: 30 minutes  Chrystie Nose, MD, [facility] Attending Cardiologist [facility]  HILTY,Kenneth C 03/02/2012, 3:27 PM

## 2012-03-02 NOTE — Progress Notes (Signed)
Called report to [place] at 1034.  Pt was transported to new unit on 2 L [place], with heparin IVF infusing.  Pt was alert and oriented prior to transport without questions nor concerns.  Pt's belongings were carried with pt and pt informed nurse that he would communicate with family news of transfer.  Vital signs prior to transfer are documented in doc flow sheets.

## 2012-03-02 NOTE — Plan of Care (Signed)
Problem: Phase II Progression Outcomes Goal: Progress activity as tolerated unless otherwise ordered Outcome: Progressing Pt sitting on side of bed using urinal as needed

## 2012-03-02 NOTE — Progress Notes (Signed)
ANTICOAGULATION CONSULT NOTE   Pharmacy Consult for heparin and warfarin Indication: pulmonary embolus  Allergies  Allergen Reactions  . Codeine     Stomach upset  . Demerol (Meperidine)     Stomach upset    Patient Measurements: Height: 6\' 4"  (193 cm) Weight: 209 lb 14.1 oz (95.2 kg) IBW/kg (Calculated) : 86.8   Vital Signs: Temp: 98.8 F (37.1 C) (12/11 0737) Temp src: Oral (12/11 0737) BP: 110/66 mmHg (12/11 0737) Pulse Rate: 87  (12/11 0737)  Labs:  Basename 03/02/12 0520 03/01/12 2029 03/01/12 2028 03/01/12 1602 03/01/12 1247 03/01/12 1038 03/01/12 0447  HGB 13.0 12.9* -- -- -- -- --  HCT 37.6* 37.4* -- -- -- -- 42.0  PLT 215 193 -- -- -- -- 227  APTT -- -- -- -- -- -- --  LABPROT 15.5* -- -- -- -- -- --  INR 1.25 -- -- -- -- -- --  HEPARINUNFRC 0.28* 0.27* -- -- 0.14* -- --  CREATININE 0.92 -- -- -- -- -- 0.90  CKTOTAL -- -- -- -- -- -- --  CKMB -- -- -- -- -- -- --  TROPONINI -- -- <0.30 <0.30 -- <0.30 --    Estimated Creatinine Clearance: 104.8 ml/min (by C-G formula based on Cr of 0.92).   Medical History: Past Medical History  Diagnosis Date  . PONV (postoperative nausea and vomiting)   . GERD (gastroesophageal reflux disease)     Assessment: 61yo male had been seen at [facility] on Sunday and dx'd with PNA, now c/o no improvement despite taking meds as rx'd, CT shows hemodynamically significant multiple bilateral central and segmental PE with possible pulmonary infarcts, started on heparin 12/10. INR up to 1.25 today after one dose of coumadin.  Goal of Therapy:  Heparin level 0.3-0.7 units/ml Monitor platelets by anticoagulation protocol: Yes INR = 2-3 Plan:  Cont heparin at 2500 units/hr Warfarin 7.5mg  po x 1 dose Daily protimes

## 2012-03-02 NOTE — Progress Notes (Signed)
ANTICOAGULATION CONSULT NOTE - Follow Up Consult  Pharmacy Consult for heparin Indication: pulmonary embolus  Labs:  Basename 03/02/12 0520 03/01/12 2029 03/01/12 2028 03/01/12 1602 03/01/12 1247 03/01/12 1038 03/01/12 0447  HGB 13.0 12.9* -- -- -- -- --  HCT 37.6* 37.4* -- -- -- -- 42.0  PLT 215 193 -- -- -- -- 227  APTT -- -- -- -- -- -- --  LABPROT 15.5* -- -- -- -- -- --  INR 1.25 -- -- -- -- -- --  HEPARINUNFRC 0.28* 0.27* -- -- 0.14* -- --  CREATININE -- -- -- -- -- -- 0.90  CKTOTAL -- -- -- -- -- -- --  CKMB -- -- -- -- -- -- --  TROPONINI -- -- <0.30 <0.30 -- <0.30 --    Assessment: 61yo male remains slightly subtherapeutic on heparin with very little change in level despite rate increase.  Goal of Therapy:  Heparin level 0.3-0.7 units/ml   Plan:  Will increase heparin gtt by another 2-3 units/kg/hr to 2500 units/hr and check level in 6hr.  Colleen Can PharmD BCPS 03/02/2012,6:21 AM

## 2012-03-02 NOTE — Progress Notes (Signed)
ANTICOAGULATION CONSULT NOTE - Follow Up Consult  Pharmacy Consult for heparin Indication: pulmonary embolus  Patient Measurements:  Height: 6\' 4"  (193 cm)  Weight: 210 lb (95.255 kg)  IBW/kg (Calculated) : 86.8  Heparin dosing wt: 95.3 kg   Labs:  Basename 03/02/12 1210 03/02/12 0520 03/01/12 2029 03/01/12 2028 03/01/12 1602 03/01/12 1038 03/01/12 0447  HGB -- 13.0 12.9* -- -- -- --  HCT -- 37.6* 37.4* -- -- -- 42.0  PLT -- 215 193 -- -- -- 227  APTT -- -- -- -- -- -- --  LABPROT -- 15.5* -- -- -- -- --  INR -- 1.25 -- -- -- -- --  HEPARINUNFRC 0.29* 0.28* 0.27* -- -- -- --  CREATININE -- 0.92 -- -- -- -- 0.90  CKTOTAL -- -- -- -- -- -- --  CKMB -- -- -- -- -- -- --  TROPONINI -- -- -- <0.30 <0.30 <0.30 --    Assessment: 61yo male remains slightly subtherapeutic on heparin with very little change in level despite continued rate increase.  Discussed with RN, no complications with drip or clinical complications noted.  Goal of Therapy:  Heparin level 0.3-0.7 units/ml   Plan:  - Increase heparin gtt by another ~2 units/kg/hr to 2700 units/hr - Check level in 6hr - Continue daily heparin level and CBC  Nicko Daher L. Illene Bolus, PharmD, BCPS Clinical Pharmacist Pager: [phone] Pharmacy: [phone] 03/02/2012 2:49 PM

## 2012-03-02 NOTE — Progress Notes (Signed)
[facility] Progress Note [place] TEAM 1 - Stepdown/ICU TEAM   Kristine Tiley UJW:[id number] DOB: 22-Feb-1951 DOA: 03/01/2012 PCP: Girard Cooter, MD  Brief narrative: 61 year old male with prior history of DVT 18 months ago - onset after spinal fusion surgery. At that time he was treated for 6 months with anticoagulation. Post treatment followup Dopplers revealed no evidence of DVT. He presented to med center [facility] with shortness of breath and right-sided pleuritic chest pain for 2 days. When the symptoms first began he sought help at med center and a chest x-ray suggested possible pneumonia so he was sent out on a Z-Pak. He continued to have shortness of breath that worsened with exertion and then started having significant right middle back pain and right anterior chest pain level 10 out of 10 and had no associated fevers or chills. On the morning of admission he had one episode of hemoptysis. He presented back to med center where CT angiogram of the chest revealed acute bilateral PE in the central segmental branches and likely infarct of the lung bases. He was started on empiric anticoagulation with heparin. Routine EKG revealed ST depression in the inferior and septal leads.   Assessment/Plan:  Pulmonary embolism and infarction *Still requiring oxygen but reports improving pleuritic chest pain *Continue heparin and Coumadin with management by pharmacy team. Consideration has been given to changing Coumadin to Xarelto if his insurance will pay for this medication. UPDATE: Xarelto will be affordable for this patient so if no invasive cardiac work up anticipated can start Xarelto soon. *Echocardiogram demonstrated normal systolic function with moderate pulmonary hypertension measuring 44 mmHg and markedly increased right ventricular systolic pressure - all this likely due to acute PE i.e. RV strain *Troponins have been cycled and have been negative  DVT, lower extremity,  recurrent *Lower extremity duplex studies revealed DVT in the left tibial peroneal trunk *Patient has had recurrent DVT with no other precipitating risk factors therefore concerns have been raised about underlying hypercoagulable disorder so a panel has been obtained this admit  Abnormal ECG/ST segment depression *New finding compared to EKG 2012 *Seen on admission EKG 24 hours prior-Will repeat EKG and have asked cardiology [facility]) to evaluate *Echocardiogram shows no wall motion abnormalities  Pleuritic chest pain *Improving and related to pulmonary infarction  Acute respiratory failure with hypoxia *Continue supportive care with oxygen and treat underlying causes  Glaucoma  Low back pain  DVT prophylaxis: On full dose heparin >> coumadin or xarelto Code Status: Full Family Communication: Spoke with patient Disposition Plan: Transfer to telemetry - begin to ambulate - attempt to wean O2  Consultants: Cardiology - [facility]  Procedures: None  Antibiotics: None  HPI/Subjective: Patient currently denies shortness of breath at rest. States pleuritic chest pain has improved and is not taking as much pain medications.   Objective: Blood pressure 116/72, pulse 83, temperature 98.8 F (37.1 C), temperature source Oral, resp. rate 22, height 6\' 4"  (1.93 m), weight 95.2 kg (209 lb 14.1 oz), SpO2 97.00%.  Intake/Output Summary (Last 24 hours) at 03/02/12 1250 Last data filed at 03/02/12 1022  Gross per 24 hour  Intake  580.4 ml  Output    550 ml  Net   30.4 ml     Exam: General: No acute respiratory distress Lungs: Clear to auscultation bilaterally without wheezes or crackles, 2 L nasal cannula oxygen Cardiovascular: Regular rate and rhythm without murmur gallop or rub normal S1 and S2 Abdomen: Nontender, nondistended, soft, bowel sounds positive, no rebound, no  ascites, no appreciable mass Musculoskeletal: No significant cyanosis, clubbing of bilateral lower  extremities Neurological: Alert and oriented x3, moves all extremities x4, exam non-focal  Data Reviewed: Basic Metabolic Panel:   Lab 03/02/12 0520 03/01/12 0447  NA 135 136  K 4.4 3.8  CL 99 98  CO2 26 24  GLUCOSE 101* 131*  BUN 14 14  CREATININE 0.92 0.90  CALCIUM 9.1 9.4  MG -- --  PHOS -- --   Liver Function Tests:  Lab 03/01/12 0447  AST 15  ALT 13  ALKPHOS 65  BILITOT 0.9  PROT 7.6  ALBUMIN 3.3*    CBC:  Lab 03/02/12 0520 03/01/12 2029 03/01/12 0447  WBC 11.4* 11.7* 14.2*  NEUTROABS -- -- 11.1*  HGB 13.0 12.9* 15.0  HCT 37.6* 37.4* 42.0  MCV 93.1 92.3 92.3  PLT 215 193 227   Cardiac Enzymes:  Lab 03/01/12 2028 03/01/12 1602 03/01/12 1038 03/01/12 0447  CKTOTAL -- -- -- --  CKMB -- -- -- --  CKMBINDEX -- -- -- --  TROPONINI <0.30 <0.30 <0.30 <0.30    Recent Results (from the past 240 hour(s))  MRSA PCR SCREENING     Status: Normal   Collection Time   03/01/12  9:12 AM      Component Value Range Status Comment   MRSA by PCR NEGATIVE  NEGATIVE Final      Studies:  Recent x-ray studies have been reviewed in detail by the Attending Physician  Scheduled Meds:  Reviewed in detail by the Attending Physician   Junious Silk, ANP [facility] Office  [phone] Pager [phone]  On-Call/Text Page:      [email or web address]      password [email or web address]  If 7PM-7AM, please contact night-coverage [email or web address] Password [email or web address] 03/02/2012, 12:50 PM   LOS: 1 day   I have personally examined this patient and reviewed the entire database. I have reviewed the above note, made any necessary editorial changes, and agree with its content.  Lonia Blood, MD [facility]

## 2012-03-02 NOTE — Progress Notes (Signed)
ANTICOAGULATION CONSULT NOTE - Follow Up Consult  Pharmacy Consult for heparin Indication: pulmonary embolus  Patient Measurements:  Height: 6\' 4"  (193 cm)  Weight: 210 lb (95.255 kg)  IBW/kg (Calculated) : 86.8  Heparin dosing wt: 95.3 kg   Labs:  Basename 03/02/12 2120 03/02/12 1210 03/02/12 0520 03/01/12 2029 03/01/12 2028 03/01/12 1602 03/01/12 1038 03/01/12 0447  HGB -- -- 13.0 12.9* -- -- -- --  HCT -- -- 37.6* 37.4* -- -- -- 42.0  PLT -- -- 215 193 -- -- -- 227  APTT -- -- -- -- -- -- -- --  LABPROT -- -- 15.5* -- -- -- -- --  INR -- -- 1.25 -- -- -- -- --  HEPARINUNFRC 0.36 0.29* 0.28* -- -- -- -- --  CREATININE -- -- 0.92 -- -- -- -- 0.90  CKTOTAL -- -- -- -- -- -- -- --  CKMB -- -- -- -- -- -- -- --  TROPONINI -- -- -- -- <0.30 <0.30 <0.30 --    Assessment: 61yo on heparin for PE, heparin level therapeutic now. Plan for switching to xarelto tomorrow Am and d/c heparin after first xarelto dose is given. Pt. Renal and liver function is good. Xarelto dosing is appropriate.    Goal of Therapy:  Heparin level 0.3-0.7 units/ml   Plan:  - Continue heparin infusion at current rate. - f/u AM labs   Bayard Hugger, PharmD, BCPS  Clinical Pharmacist  Pager: [phone]  03/02/2012 10:24 PM

## 2012-03-02 NOTE — Care Management Note (Signed)
    Page 1 of 1   03/03/2012     4:59:18 PM   CARE MANAGEMENT NOTE 03/03/2012  Patient:  Wayne Arnold, Wayne Arnold   Account Number:  [id number]  Date Initiated:  03/02/2012  Documentation initiated by:  Alvira Philips Assessment:   61 yr-old male adm with dx of PE     Action/Plan:   Anticipated DC Date:  03/03/2012   Anticipated DC Plan:  HOME/SELF CARE      DC Planning Services  CM consult      Choice offered to / List presented to:             Status of service:  Completed, signed off Medicare Important Message given?   (If response is "NO", the following Medicare IM given date fields will be blank) Date Medicare IM given:   Date Additional Medicare IM given:    Discharge Disposition:  HOME/SELF CARE  Per UR Regulation:  Reviewed for med. necessity/level of care/duration of stay  If discussed at Long Length of Stay Meetings, dates discussed:    Comments:  PCP:  Dr Girard Cooter with [facility]  03/02/12 1423 [id number] RN BSN MSN CCM Pt to be started on Xarelto, copay will be $10 for 30 day supply.

## 2012-03-02 NOTE — Progress Notes (Addendum)
Pt Urinary Output per shift 200cc. Paged K.Schorr on call. No new orders received. Continuing to monitor pt.

## 2012-03-02 NOTE — Progress Notes (Signed)
Patient wanted to go to [facility], EDP contacted Dr. Izora Ribas hospitalist but there were no monitored beds in the hospital.  Asked EDP to contact another faciility explained this to patient and wife and then contacted Patrc[facility] regarding admission

## 2012-03-03 LAB — CARDIOLIPIN ANTIBODIES, IGG, IGM, IGA
Anticardiolipin IgA: 5 APL U/mL — ABNORMAL LOW (ref <22)
Anticardiolipin IgG: 8 GPL U/mL — ABNORMAL LOW (ref <23)

## 2012-03-03 LAB — CBC
HCT: 38.6 % — ABNORMAL LOW (ref 39.0–52.0)
MCHC: 33.9 g/dL (ref 30.0–36.0)
RDW: 12.8 % (ref 11.5–15.5)

## 2012-03-03 LAB — HEPARIN LEVEL (UNFRACTIONATED): Heparin Unfractionated: 0.37 IU/mL (ref 0.30–0.70)

## 2012-03-03 MED ORDER — UNABLE TO FIND: Status: DC

## 2012-03-03 MED ORDER — RIVAROXABAN 15 MG PO TABS: 15.0000 mg | ORAL_TABLET | Freq: Two times a day (BID) | ORAL | Status: DC

## 2012-03-03 MED ORDER — RIVAROXABAN 20 MG PO TABS: 20.0000 mg | ORAL_TABLET | Freq: Every day | ORAL | Status: DC

## 2012-03-03 NOTE — Progress Notes (Signed)
Pt discharged to home per MD order. Pt received and reviewed all discharge instructions and medication information including follow-up appointment information and prescription pick-up information. Pt received xarelto education prior to discharge as well. Pt alert and oriented at discharge with no complaints of pain.  Pt escorted to private vehicle via wheelchair by nurse tech. Efraim Kaufmann

## 2012-03-03 NOTE — Progress Notes (Signed)
ANTICOAGULATION CONSULT NOTE - Follow Up Consult  Pharmacy Consult for heparin Indication: pulmonary embolus  Patient Measurements:  Height: 6\' 4"  (193 cm)  Weight: 210 lb (95.255 kg)  IBW/kg (Calculated) : 86.8  Heparin dosing wt: 95.3 kg   Labs:  Basename 03/03/12 0610 03/02/12 2120 03/02/12 1210 03/02/12 0520 03/01/12 2029 03/01/12 2028 03/01/12 1602 03/01/12 1038 03/01/12 0447  HGB 13.1 -- -- 13.0 -- -- -- -- --  HCT 38.6* -- -- 37.6* 37.4* -- -- -- --  PLT 229 -- -- 215 193 -- -- -- --  APTT -- -- -- -- -- -- -- -- --  LABPROT -- -- -- 15.5* -- -- -- -- --  INR -- -- -- 1.25 -- -- -- -- --  HEPARINUNFRC 0.37 0.36 0.29* -- -- -- -- -- --  CREATININE -- -- -- 0.92 -- -- -- -- 0.90  CKTOTAL -- -- -- -- -- -- -- -- --  CKMB -- -- -- -- -- -- -- -- --  TROPONINI -- -- -- -- -- <0.30 <0.30 <0.30 --    Assessment: 61yo on heparin for PE, heparin level therapeutic this am. Plan for switching to xarelto  At 1000 and d/c heparin after first xarelto dose is given. Pt. Renal and liver function is good. Xarelto dosing is appropriate.   Goal of Therapy:  Heparin level 0.3-0.7 units/ml   Plan:  - Continue heparin infusion at 2700 units/hr - Heparin off at 1000 when xarelto starts - Provide Xarelto education  Jill Side L. Illene Bolus, PharmD, BCPS Clinical Pharmacist Pager: [phone] Pharmacy: [phone] 03/03/2012 9:19 AM

## 2012-03-03 NOTE — Evaluation (Signed)
Physical Therapy Evaluation Patient Details Name: Wayne Arnold MRN: [id number] DOB: 29-Aug-1950 Today's Date: 03/03/2012 Time: 1000-1026 PT Time Calculation (min): 26 min  PT Assessment / Plan / Recommendation Clinical Impression  pt adm with pleuritic type chest pain.  Found to have PE.  On eval, pt at baseline functioning with sats maintained between 91-95% with exertion, EHR  high 80's to lmid 90's.  Pt with no further PT needs.   D/C.    PT Assessment  Patent does not need any further PT services    Follow Up Recommendations  No PT follow up    Does the patient have the potential to tolerate intense rehabilitation      Barriers to Discharge        Equipment Recommendations  None recommended by PT    Recommendations for Other Services     Frequency      Precautions / Restrictions Precautions Precautions: None Restrictions Weight Bearing Restrictions: No   Pertinent Vitals/Pain       Mobility  Bed Mobility Bed Mobility: Supine to Sit;Sitting - Scoot to Edge of Bed Supine to Sit: 7: Independent Sitting - Scoot to [place] of Bed: 7: Independent Transfers Transfers: Sit to Stand;Stand to Sit Sit to Stand: 7: Independent Stand to Sit: 7: Independent Ambulation/Gait Ambulation/Gait Assistance: 7: Independent Ambulation Distance (Feet): 400 Feet Assistive device: None Ambulation/Gait Assistance Details: steady, subjectively at community-level gait speed Gait Pattern: Within Functional Limits Stairs: Yes Stairs Assistance: 6: Modified independent (Device/Increase time) Stair Management Technique: One rail Right;Alternating pattern;Forwards Number of Stairs: 6  Wheelchair Mobility Wheelchair Mobility: No    Shoulder Instructions     Exercises     PT Diagnosis:    PT Problem List:   PT Treatment Interventions:     PT Goals    Visit Information  Last PT Received On: 03/03/12 Assistance Needed: +1    Subjective Data  Subjective: Feeling pretty  good...none of the chest pain Patient Stated Goal: Back to work   Prior Functioning  Home Living Lives With: Spouse Available Help at Discharge: Family Type of Home: House Home Access: Stairs to enter Entrance Stairs-Rails: None Home Layout: Two level;Bed/bath upstairs Alternate Level Stairs-Number of Steps: flight Alternate Level Stairs-Rails: Can reach both Home Adaptive Equipment: None Prior Function Level of Independence: Independent Able to Take Stairs?: Yes Driving: Yes Vocation: Full time employment Communication Communication: No difficulties    Cognition  Overall Cognitive Status: Appears within functional limits for tasks assessed/performed Arousal/Alertness: Awake/alert Orientation Level: Appears intact for tasks assessed Behavior During Session: [facility] for tasks performed    Extremity/Trunk Assessment Right Lower Extremity Assessment RLE ROM/Strength/Tone: Within functional levels Left Lower Extremity Assessment LLE ROM/Strength/Tone: Within functional levels Trunk Assessment Trunk Assessment: Normal   Balance Standardized Balance Assessment Standardized Balance Assessment: Dynamic Gait Index Dynamic Gait Index Change in Gait Speed: Normal Gait with Horizontal Head Turns: Normal Gait with Vertical Head Turns: Normal Gait and Pivot Turn: Normal Step Over Obstacle: Normal Step Around Obstacles: Normal Steps: Normal  End of Session PT - End of Session Activity Tolerance: Patient tolerated treatment well Patient left: Other (comment) (sitting EOB) Nurse Communication: Mobility status  GP     Wayne Arnold, Wayne Arnold 03/03/2012, 10:34 AM  03/03/2012  [place], [place] [phone] (pager)

## 2012-03-04 NOTE — Discharge Summary (Signed)
Physician Discharge Summary  Wayne Arnold YNW:[id number] DOB: [birth year] DOA: 03/01/2012  PCP: Girard Cooter, MD  Admit date: 03/01/2012 Discharge date: 03/03/12 Recommendations for Outpatient Follow-up:  1. Pt will need to follow up with PCP in 2 weeks post discharge  Discharge Diagnoses:  Principal Problem:  *DVT, lower extremity, recurrent Active Problems:  Pulmonary embolism and infarction  Glaucoma  Low back pain  Abnormal ECG/ST segment depression  Pleuritic chest pain  Acute respiratory failure with hypoxia   Discharge Condition: stable  Disposition: d/c home  Diet:cardiac Wt Readings from Last 3 Encounters:  03/03/12 94.575 kg (208 lb 8 oz)  02/28/12 95.255 kg (210 lb)    History of present illness:  61 year old male with prior history of DVT 18 months ago - onset after spinal fusion surgery. At that time he was treated for 6 months with anticoagulation. Post treatment followup Dopplers revealed no evidence of DVT. He presented to med center [facility] with shortness of breath and right-sided pleuritic chest pain for 2 days. When the symptoms first began he sought help at med center and a chest x-ray suggested possible pneumonia so he was sent out on a Z-Pak. He continued to have shortness of breath that worsened with exertion and then started having significant right middle back pain and right anterior chest pain level 10 out of 10 and had no associated fevers or chills. On the morning of admission he had one episode of hemoptysis. He presented back to med center where CT angiogram of the chest revealed acute bilateral PE in the central segmental branches and likely infarct of the lung bases. He was started on empiric anticoagulation with heparin. Routine EKG revealed ST depression in the inferior and septal leads.    Hospital Course:  The patient was started on intravenous heparin and Coumadin initially. However, after it was determined that the patient's insurance would  pay for rivaroxaban, the patient's Coumadin was discontinued and the patient was started on rivaroxaban. His heparin drip was discontinued after his first dose of rivaroxaban. Further history revealed that patient had a DVT in his left lower extremity approximately 2 months prior to this pulmonary embolus. This happened approximately 2 weeks after his back surgery. It was thought that this was a provoked DVT. The patient finished 6 months of anticoagulation with warfarin. Because of the abnormal EKG, cardiology was consulted to see the patient. The patient's serial troponins were negative. Cardiology felt that the patient's ST segment depressions which resolved, was likely due to RV strain from his acute pulmonary embolus. They recommended that the patient undergo a LexiScan nuclear stress test as an outpatient after one month. An outpatient cardiology appointment will be arranged for the patient by Dr. Rennis Golden. A hypercoagulable disorder panel was obtained on the patient. His lupus anticoagulant was negative. His protein C was noted to be low, but in the presence of an acute DVT this may be falsely low. A long discussion was undertaken with the patient and his wife. An argument could be made for lifelong anticoagulation. It was suggested to the patient that if he wanted a complete hypercoagulable workup, that he would have to ultimately stop his anticoagulation for 4-6 weeks after his full course of therapy in order to minimize any false positive testing with regard to protein C, protein S, and antithrombin III.Marland Kitchen The patient stated that he would discuss this primary care provider. The patient's pleuritic chest discomfort and shortness of breath gradually improved. Prior to discharge, ambulatory pulse oximetry did not show  any oxygen desaturation. The patient did not have any complications taking Xarelto. He was instructed to take Xarelto 15 mg twice a day x21 days, then 20 mg  once daily indefinitely. All the risks,  benefits, and alternatives were discussed with the patient and his wife. He expressed understanding and agreed to follow treatment protocol.  Consultants: Cardiology--[facility]  Discharge Exam: Filed Vitals:   03/03/12 0607  BP: 106/70  Pulse: 74  Temp: 98.1 F (36.7 C)  Resp: 20   Filed Vitals:   03/02/12 1100 03/02/12 1400 03/02/12 2116 03/03/12 0607  BP: 116/72 114/68 122/73 106/70  Pulse: 83 82 89 74  Temp:  98.7 F (37.1 C) 98.2 F (36.8 C) 98.1 F (36.7 C)  TempSrc:  Oral Oral Oral  Resp: 22 20 18 20   Height:      Weight:    94.575 kg (208 lb 8 oz)  SpO2: 97% 98% 91% 94%   General: A&O x 3, NAD, pleasant, cooperative Cardiovascular: RRR, no rub, no gallop, no S3 Respiratory: CTAB, no wheeze, no rhonchi Abdomen:soft, nontender, nondistended, positive bowel sounds Extremities: No edema, No lymphangitis, no petechiae  Discharge Instructions  Discharge Orders    Future Orders Please Complete By Expires   Diet - low sodium heart healthy      Increase activity slowly      Discharge instructions      Comments:   Follow up with primary care doctor in 2-3 weeks Xarelto 15mg  twice a day x 21 days On day #22, start Xarelto 20mg  with breakfast. Call doctor immediately if notice any coughing blood, bloody stool, or black tarry stool or blood in urine       Medication List     As of 03/04/2012  5:44 PM    STOP taking these medications         aspirin 500 MG EC tablet      azithromycin 250 MG tablet   Commonly known as: ZITHROMAX      TAKE these medications         b complex vitamins tablet   Take 1 tablet by mouth every other day.      CALCIUM 500 + D PO   Take 1 tablet by mouth every other day.      Co Q 10 100 MG Caps   Take 1 tablet by mouth daily.      oxyCODONE-acetaminophen 5-325 MG per tablet   Commonly known as: PERCOCET/ROXICET   Take 2 tablets by mouth every 4 (four) hours as needed for pain.      Rivaroxaban 15 MG Tabs tablet   Commonly known  as: XARELTO   Take 1 tablet (15 mg total) by mouth 2 (two) times daily.      Rivaroxaban 20 MG Tabs   Commonly known as: XARELTO   Take 1 tablet (20 mg total) by mouth daily. Start the day after finished with 15mg  doses      UNABLE TO FIND   Mr. Hiroshi Krummel was admitted to [facility] between 03/01/12-03/03/12.         The results of significant diagnostics from this hospitalization (including imaging, microbiology, ancillary and laboratory) are listed below for reference.    Significant Diagnostic Studies: Dg Chest 2 View  03/01/2012  *RADIOLOGY REPORT*  Clinical Data: Mid right chest pain.  CHEST - 2 VIEW  Comparison: 02/28/2012  Findings: The normal heart size and pulmonary vascularity. Peribronchial thickening with increased perihilar markings since previous study likely  to represent bronchitis.  There is a developing small pleural effusion versus pleural thickening.  Early pneumonia is not excluded.  No focal consolidation.  No pneumothorax.  Mediastinal contours appear intact.  Rib fractures seen on previous rib views are not demonstrated on today's study.  IMPRESSION: Peribronchial thickening with increased perihilar markings most suggestive of bronchitis.  Early pneumonia not excluded. Developing small right pleural effusion or pleural thickening.   Original Report Authenticated By: Burman Nieves, M.D.    Dg Ribs Unilateral W/chest Right  02/28/2012  *RADIOLOGY REPORT*  Clinical Data: Flu like symptoms.  Coughing spell and injured right posterior ribs.  RIGHT RIBS AND CHEST - 3+ VIEW  Comparison: Chest radiograph dated 06/20/2010  Findings: There are vague patchy densities in the right perihilar region that could represent subtle airspace disease and infection. No evidence for a pneumothorax.  Heart and mediastinum are within normal limits.  The patient has pedicle screw fixation in the lumbar spine. There may be subtle cortical irregularity involving the right  seventh rib and eighth ribs.  IMPRESSION: Subtle patchy densities in the right lung could represent infection.  Mild cortical irregularity involving the right seventh and eighth ribs.  These could represent minimally-displaced fractures.   Original Report Authenticated By: Richarda Overlie, M.D.    Ct Angio Chest Pe W/cm &/or Wo Cm  03/01/2012  *RADIOLOGY REPORT*  Clinical Data: Chest pain and shortness of breath.  CT ANGIOGRAPHY CHEST  Technique:  Multidetector CT imaging of the chest using the standard protocol during bolus administration of intravenous contrast. Multiplanar reconstructed images including MIPs were obtained and reviewed to evaluate the vascular anatomy.  Contrast: 80mL OMNIPAQUE IOHEXOL 350 MG/ML SOLN  Comparison: None.  Findings: Technically adequate study with good opacification of the central and segmental pulmonary arteries.  There are large central filling defects in the main pulmonary arteries bilaterally extending into multiple lobar and segmental upper and lower lobe branches.  Changes are consistent with significant pulmonary embolus with prominent clot burden.  Normal heart size.  Normal caliber thoracic aorta.  No significant lymphadenopathy in the chest.  Esophagus is decompressed. Atelectasis or infiltration in both lung bases.  Changes may be related to infarct. Small bilateral pleural effusions.  No pneumothorax.  Visualized upper abdominal organs are grossly unremarkable except for small hepatic cysts.  Small esophageal hiatal hernia.  The degenerative changes in the thoracic spine.  IMPRESSION: Positive study for multiple bilateral central and segmental pulmonary emboli.  Infiltrates in the lung bases could represent pulmonary infarcts.  Small bilateral pleural effusions.  Results were discussed by telephone with Dr. Nicanor Alcon at 0545 hours on 03/01/2012.   Original Report Authenticated By: Burman Nieves, M.D.      Microbiology: Recent Results (from the past 240 hour(s))  MRSA  PCR SCREENING     Status: Normal   Collection Time   03/01/12  9:12 AM      Component Value Range Status Comment   MRSA by PCR NEGATIVE  NEGATIVE Final      Labs: Basic Metabolic Panel:  Lab 03/02/12 4098 03/01/12 0447  NA 135 136  K 4.4 3.8  CL 99 98  CO2 26 24  GLUCOSE 101* 131*  BUN 14 14  CREATININE 0.92 0.90  CALCIUM 9.1 9.4  MG -- --  PHOS -- --   Liver Function Tests:  Lab 03/01/12 0447  AST 15  ALT 13  ALKPHOS 65  BILITOT 0.9  PROT 7.6  ALBUMIN 3.3*   No results found for  this basename: LIPASE:5,AMYLASE:5 in the last 168 hours No results found for this basename: AMMONIA:5 in the last 168 hours CBC:  Lab 03/03/12 0610 03/02/12 0520 03/01/12 2029 03/01/12 0447  WBC 9.2 11.4* 11.7* 14.2*  NEUTROABS -- -- -- 11.1*  HGB 13.1 13.0 12.9* 15.0  HCT 38.6* 37.6* 37.4* 42.0  MCV 93.5 93.1 92.3 92.3  PLT 229 215 193 227   Cardiac Enzymes:  Lab 03/01/12 2028 03/01/12 1602 03/01/12 1038 03/01/12 0447  CKTOTAL -- -- -- --  CKMB -- -- -- --  CKMBINDEX -- -- -- --  TROPONINI <0.30 <0.30 <0.30 <0.30   BNP: No components found with this basename: POCBNP:5 CBG: No results found for this basename: GLUCAP:5 in the last 168 hours  Time coordinating discharge:  Greater than 30 minutes  Signed:  Obi Scrima, DO [facility] Pager: [phone] 03/04/2012, 5:44 PM

## 2012-04-04 ENCOUNTER — Emergency Department (HOSPITAL_BASED_OUTPATIENT_CLINIC_OR_DEPARTMENT_OTHER): Payer: BC Managed Care – PPO

## 2012-04-04 ENCOUNTER — Emergency Department (HOSPITAL_BASED_OUTPATIENT_CLINIC_OR_DEPARTMENT_OTHER)
Admission: EM | Admit: 2012-04-04 | Discharge: 2012-04-04 | Disposition: A | Discharge status: Home / Self Care | Payer: BC Managed Care – PPO | Attending: Emergency Medicine | Admitting: Emergency Medicine

## 2012-04-04 ENCOUNTER — Encounter (HOSPITAL_BASED_OUTPATIENT_CLINIC_OR_DEPARTMENT_OTHER): Payer: Self-pay | Admitting: *Deleted

## 2012-04-04 DIAGNOSIS — M25469 Effusion, unspecified knee: Secondary | ICD-10-CM

## 2012-04-04 DIAGNOSIS — Z86711 Personal history of pulmonary embolism: Secondary | ICD-10-CM | POA: Insufficient documentation

## 2012-04-04 DIAGNOSIS — Z8719 Personal history of other diseases of the digestive system: Secondary | ICD-10-CM | POA: Insufficient documentation

## 2012-04-04 DIAGNOSIS — Z79899 Other long term (current) drug therapy: Secondary | ICD-10-CM | POA: Insufficient documentation

## 2012-04-04 DIAGNOSIS — R609 Edema, unspecified: Secondary | ICD-10-CM | POA: Insufficient documentation

## 2012-04-04 MED ORDER — OXYCODONE-ACETAMINOPHEN 5-325 MG PO TABS
2.0000 | ORAL_TABLET | Freq: Once | ORAL | Status: AC
  Administered 2012-04-04: 2 via ORAL
  Filled 2012-04-04 (×2): qty 2

## 2012-04-04 MED ORDER — OXYCODONE-ACETAMINOPHEN 5-325 MG PO TABS: 2.0000 | ORAL_TABLET | ORAL | Status: DC | PRN

## 2012-04-04 NOTE — ED Notes (Signed)
Family at bedside. 

## 2012-04-04 NOTE — ED Notes (Signed)
Patient transported to Ultrasound 

## 2012-04-04 NOTE — ED Notes (Signed)
Patient transported to X-ray 

## 2012-04-04 NOTE — ED Provider Notes (Signed)
History     CSN: [id number]  Arrival date & time 04/04/12  0911   First MD Initiated Contact with Patient 04/04/12 0930      Chief Complaint  Patient presents with  . Knee Pain    (Consider location/radiation/quality/duration/timing/severity/associated sxs/prior treatment) HPI Comments: Patient presents with a three-day history of worsening pain to his right knee. He denies any known injuries. He was recently admitted to the hospital in December for bilateral pulmonary emboli and was started on Xarelto initially. He developed multiple joint pains off on this and stopped it about a week ago and was switched to Eliquis. He states his other joints have stopped hurting but now is having worsening pain to his right knee to the point that it's hard to walk on it. He's been using Tylenol at home without relief. He denies any fevers or chills. He states his chest pain and shortness of breath have essentially resolved. He's had some mild swelling to his knee but denies any other leg swelling or pain. Denies any pain in his posterior calf.  Patient is a 62 y.o. male presenting with knee pain.  Knee Pain Pertinent negatives include no chest pain, no abdominal pain, no headaches and no shortness of breath.    Past Medical History  Diagnosis Date  . PONV (postoperative nausea and vomiting)   . GERD (gastroesophageal reflux disease)     Past Surgical History  Procedure Date  . Back surgery 2012    two spinal fusions  . Shoulder arthroscopy     History reviewed. No pertinent family history.  History  Substance Use Topics  . Smoking status: Never Smoker   . Smokeless tobacco: Not on file  . Alcohol Use: 1.2 oz/week    2 Glasses of wine per week      Review of Systems  Constitutional: Negative for fever, chills, diaphoresis and fatigue.  HENT: Negative for congestion, rhinorrhea and sneezing.   Eyes: Negative.   Respiratory: Negative for cough, chest tightness and shortness of  breath.   Cardiovascular: Negative for chest pain and leg swelling.  Gastrointestinal: Negative for nausea, vomiting, abdominal pain, diarrhea and blood in stool.  Genitourinary: Negative for frequency, hematuria, flank pain and difficulty urinating.  Musculoskeletal: Positive for joint swelling. Negative for back pain and arthralgias.  Skin: Negative for rash.  Neurological: Negative for dizziness, speech difficulty, weakness, numbness and headaches.    Allergies  Codeine and Demerol  Home Medications  reviewed  BP 120/78  Pulse 70  Temp 97.5 F (36.4 C) (Oral)  Resp 20  Ht 6\' 3"  (1.905 m)  Wt 205 lb (92.987 kg)  BMI 25.62 kg/m2  SpO2 99%  Physical Exam  Constitutional: He is oriented to person, place, and time. He appears well-developed and well-nourished.  HENT:  Head: Normocephalic and atraumatic.  Eyes: Pupils are equal, round, and reactive to light.  Neck: Normal range of motion. Neck supple.  Cardiovascular: Normal rate, regular rhythm and normal heart sounds.   Pulmonary/Chest: Effort normal and breath sounds normal. No respiratory distress. He has no wheezes. He has no rales. He exhibits no tenderness.  Abdominal: Soft. Bowel sounds are normal. There is no tenderness. There is no rebound and no guarding.  Musculoskeletal: Normal range of motion. He exhibits edema and tenderness.       Right knee: He exhibits swelling and effusion (mild). He exhibits no erythema. tenderness found. Lateral joint line tenderness noted.       Mild warmth to right knee.  Mild TTP posterior knee and lower leg.  Lymphadenopathy:    He has no cervical adenopathy.  Neurological: He is alert and oriented to person, place, and time.  Skin: Skin is warm and dry. No rash noted.  Psychiatric: He has a normal mood and affect.    ED Course  Procedures (including critical care time)  Results for orders placed during the hospital encounter of 03/01/12  CBC WITH DIFFERENTIAL      Component  Value Range   WBC 14.2 (*) 4.0 - 10.5 K/uL   RBC 4.55  4.22 - 5.81 MIL/uL   Hemoglobin 15.0  13.0 - 17.0 g/dL   HCT 16.1  09.6 - 04.5 %   MCV 92.3  78.0 - 100.0 fL   MCH 33.0  26.0 - 34.0 pg   MCHC 35.7  30.0 - 36.0 g/dL   RDW 40.9  81.1 - 91.4 %   Platelets 227  150 - 400 K/uL   Neutrophils Relative 78 (*) 43 - 77 %   Neutro Abs 11.1 (*) 1.7 - 7.7 K/uL   Lymphocytes Relative 11 (*) 12 - 46 %   Lymphs Abs 1.5  0.7 - 4.0 K/uL   Monocytes Relative 10  3 - 12 %   Monocytes Absolute 1.4 (*) 0.1 - 1.0 K/uL   Eosinophils Relative 1  0 - 5 %   Eosinophils Absolute 0.2  0.0 - 0.7 K/uL   Basophils Relative 0  0 - 1 %   Basophils Absolute 0.0  0.0 - 0.1 K/uL  COMPREHENSIVE METABOLIC PANEL      Component Value Range   Sodium 136  135 - 145 mEq/L   Potassium 3.8  3.5 - 5.1 mEq/L   Chloride 98  96 - 112 mEq/L   CO2 24  19 - 32 mEq/L   Glucose, Bld 131 (*) 70 - 99 mg/dL   BUN 14  6 - 23 mg/dL   Creatinine, Ser 7.82  0.50 - 1.35 mg/dL   Calcium 9.4  8.4 - 95.6 mg/dL   Total Protein 7.6  6.0 - 8.3 g/dL   Albumin 3.3 (*) 3.5 - 5.2 g/dL   AST 15  0 - 37 U/L   ALT 13  0 - 53 U/L   Alkaline Phosphatase 65  39 - 117 U/L   Total Bilirubin 0.9  0.3 - 1.2 mg/dL   GFR calc non Af Amer >90  >90 mL/min   GFR calc Af Amer >90  >90 mL/min  TROPONIN I      Component Value Range   Troponin I <0.30  <0.30 ng/mL  MRSA PCR SCREENING      Component Value Range   MRSA by PCR NEGATIVE  NEGATIVE  URINALYSIS, ROUTINE W REFLEX MICROSCOPIC      Component Value Range   Color, Urine ORANGE (*) YELLOW   APPearance CLEAR  CLEAR   Specific Gravity, Urine 1.015  1.005 - 1.030   pH 6.0  5.0 - 8.0   Glucose, UA NEGATIVE  NEGATIVE mg/dL   Hgb urine dipstick NEGATIVE  NEGATIVE   Bilirubin Urine SMALL (*) NEGATIVE   Ketones, ur 40 (*) NEGATIVE mg/dL   Protein, ur NEGATIVE  NEGATIVE mg/dL   Urobilinogen, UA 1.0  0.0 - 1.0 mg/dL   Nitrite NEGATIVE  NEGATIVE   Leukocytes, UA NEGATIVE  NEGATIVE  TROPONIN I       Component Value Range   Troponin I <0.30  <0.30 ng/mL  TROPONIN I  Component Value Range   Troponin I <0.30  <0.30 ng/mL  TROPONIN I      Component Value Range   Troponin I <0.30  <0.30 ng/mL  CBC      Component Value Range   WBC 11.7 (*) 4.0 - 10.5 K/uL   RBC 4.05 (*) 4.22 - 5.81 MIL/uL   Hemoglobin 12.9 (*) 13.0 - 17.0 g/dL   HCT 16.1 (*) 09.6 - 04.5 %   MCV 92.3  78.0 - 100.0 fL   MCH 31.9  26.0 - 34.0 pg   MCHC 34.5  30.0 - 36.0 g/dL   RDW 40.9  81.1 - 91.4 %   Platelets 193  150 - 400 K/uL  ANTITHROMBIN III      Component Value Range   AntiThromb III Func 85  75 - 120 %  PROTEIN C ACTIVITY      Component Value Range   Protein C Activity 110  75 - 133 %  PROTEIN C, TOTAL      Component Value Range   Protein C, Total 68 (*) 72 - 160 %  PROTEIN S ACTIVITY      Component Value Range   Protein S Activity 90  69 - 129 %  PROTEIN S, TOTAL      Component Value Range   Protein S Ag, Total 79  60 - 150 %  LUPUS ANTICOAGULANT PANEL      Component Value Range   PTT Lupus Anticoagulant 80.4 (*) 28.0 - 43.0 secs   PTTLA Confirmation 22.5 (*) <8.0 secs   PTTLA 4:1 Mix 75.7 (*) 28.0 - 43.0 secs   Drvvt 39.3  <42.9 secs   Drvvt confirmation NOT APPL  <1.11 Ratio   dRVVT Incubated 1:1 Mix NOT APPL  <42.9 secs   Lupus Anticoagulant DETECTED (*) NOT DETECTED  BETA-2-GLYCOPROTEIN I ABS, IGG/M/A      Component Value Range   Beta-2 Glyco I IgG 1  <20 G Units   Beta-2-Glycoprotein I IgM 12  <20 M Units   Beta-2-Glycoprotein I IgA 5  <20 A Units  HOMOCYSTEINE      Component Value Range   Homocysteine 10.7  4.0 - 15.4 umol/L  FACTOR 5 LEIDEN      Component Value Range   Recommendations-F5LEID: (NOTE)    CARDIOLIPIN ANTIBODIES, IGG, IGM, IGA      Component Value Range   Anticardiolipin IgG 8 (*) <23 GPL U/mL   Anticardiolipin IgM 6 (*) <11 MPL U/mL   Anticardiolipin IgA 5 (*) <22 APL U/mL  HEPARIN LEVEL (UNFRACTIONATED)      Component Value Range   Heparin Unfractionated  0.14 (*) 0.30 - 0.70 IU/mL  HEPARIN LEVEL (UNFRACTIONATED)      Component Value Range   Heparin Unfractionated 0.27 (*) 0.30 - 0.70 IU/mL  BASIC METABOLIC PANEL      Component Value Range   Sodium 135  135 - 145 mEq/L   Potassium 4.4  3.5 - 5.1 mEq/L   Chloride 99  96 - 112 mEq/L   CO2 26  19 - 32 mEq/L   Glucose, Bld 101 (*) 70 - 99 mg/dL   BUN 14  6 - 23 mg/dL   Creatinine, Ser 7.82  0.50 - 1.35 mg/dL   Calcium 9.1  8.4 - 95.6 mg/dL   GFR calc non Af Amer 90 (*) >90 mL/min   GFR calc Af Amer >90  >90 mL/min  CBC      Component Value Range   WBC  11.4 (*) 4.0 - 10.5 K/uL   RBC 4.04 (*) 4.22 - 5.81 MIL/uL   Hemoglobin 13.0  13.0 - 17.0 g/dL   HCT 16.1 (*) 09.6 - 04.5 %   MCV 93.1  78.0 - 100.0 fL   MCH 32.2  26.0 - 34.0 pg   MCHC 34.6  30.0 - 36.0 g/dL   RDW 40.9  81.1 - 91.4 %   Platelets 215  150 - 400 K/uL  PROTIME-INR      Component Value Range   Prothrombin Time 15.5 (*) 11.6 - 15.2 seconds   INR 1.25  0.00 - 1.49  HEPARIN LEVEL (UNFRACTIONATED)      Component Value Range   Heparin Unfractionated 0.28 (*) 0.30 - 0.70 IU/mL  HEPARIN LEVEL (UNFRACTIONATED)      Component Value Range   Heparin Unfractionated 0.29 (*) 0.30 - 0.70 IU/mL  HEPARIN LEVEL (UNFRACTIONATED)      Component Value Range   Heparin Unfractionated 0.36  0.30 - 0.70 IU/mL  CBC      Component Value Range   WBC 9.2  4.0 - 10.5 K/uL   RBC 4.13 (*) 4.22 - 5.81 MIL/uL   Hemoglobin 13.1  13.0 - 17.0 g/dL   HCT 78.2 (*) 95.6 - 21.3 %   MCV 93.5  78.0 - 100.0 fL   MCH 31.7  26.0 - 34.0 pg   MCHC 33.9  30.0 - 36.0 g/dL   RDW 08.6  57.8 - 46.9 %   Platelets 229  150 - 400 K/uL  HEPARIN LEVEL (UNFRACTIONATED)      Component Value Range   Heparin Unfractionated 0.37  0.30 - 0.70 IU/mL   Dg Knee 2 Views Right  04/04/2012  *RADIOLOGY REPORT*  Clinical Data: Right knee pain for 4-6 weeks  RIGHT KNEE - 1-2 VIEW  Comparison: None  Findings: Osseous demineralization. Medial compartment joint space narrowing  and minimal spur formation. Mild joint space narrowing at patellofemoral joint. Small knee joint effusion. No acute fracture, dislocation or bone destruction. Regional soft tissues otherwise unremarkable.  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   Original Report Authenticated By: Ulyses Southward, M.D.    US Venous Img Lower Unilateral Right  04/04/2012  *RADIOLOGY REPORT*  Clinical Data: Right knee pain, history of pulmonary emboli and deep venous thrombosis, prior varicose vein stripping and laser procedures, switched to new blood thinner recently  RIGHT LOWER EXTREMITY VENOUS DUPLEX ULTRASOUND  Technique:  Gray-scale sonography with graded compression, as well as color Doppler and duplex ultrasound, were performed to evaluate the deep venous system of the lower extremity from the level of the common femoral vein through the popliteal and proximal calf veins. Spectral Doppler was utilized to evaluate flow at rest and with distal augmentation maneuvers.  Comparison:  None  Findings: Deep venous system patent and compressible from right groin through popliteal fossa. Spontaneous venous flow present with intact augmentation and evidence of respiratory phasicity. No intraluminal thrombus identified. Post saphenous vein stripping. Complex Baker's cyst right popliteal fossa 3.7 x 2.7 x 3.7 cm in size.  IMPRESSION: No evidence of deep venous thrombosis in the right lower extremity. Complex Baker's cyst right popliteal fossa.   Original Report Authenticated By: Ulyses Southward, M.D.      1. Knee effusion       MDM  Patient presents with a small right joint effusion. She's had other recent joint pain that was thought to be related to his anticoagulant.  The differential diagnosis for this effusion would be inflammatory response versus hemarthrosis. Have a lower suspicion of septic joint. There is no evidence of DVT on ultrasound. I discussed the  findings with his primary care physician who is Dr. Willa Rough in [facility] and she is going to switch his anticoagulant to Coumadin. She will fax in a prescription for him to start today. She's also going to arrange followup for him within the next one to 2 days for recheck on the effusion. I advised him that I am reluctant to do an arthrocentesis at this point given his anticoagulant state. And I have a low suspicion of septic joint however I advised him he needs to have it rechecked within the next couple days to make sure it's improving and certainly not worsening.        Rolan Bucco, MD 04/04/12 [phone]

## 2012-04-04 NOTE — ED Notes (Signed)
Pt having severe pain in right knee onset yesterday pt has been here 3 times in a month first diagnosed with pneumonia and broken ribs second with "multiple blood clots in lungs" sent to [facility] admitted treated discharged on xerelto states he has many side effects from it including dizziness and joint pain they changed to Eliquest has been on it for one week still having some joint pain but not as much now only in his right knee. Here for evaluation denies any injury

## 2014-03-31 IMAGING — CR DG RIBS W/ CHEST 3+V*R*
4 series · 4 of 4 positions shown · non-contrast
Comparison: Chest radiograph dated 06/20/2010

CLINICAL DATA: Flu like symptoms.  Coughing Masz and injured right
posterior ribs.

RIGHT RIBS AND CHEST - 3+ VIEW

[w chest pa]
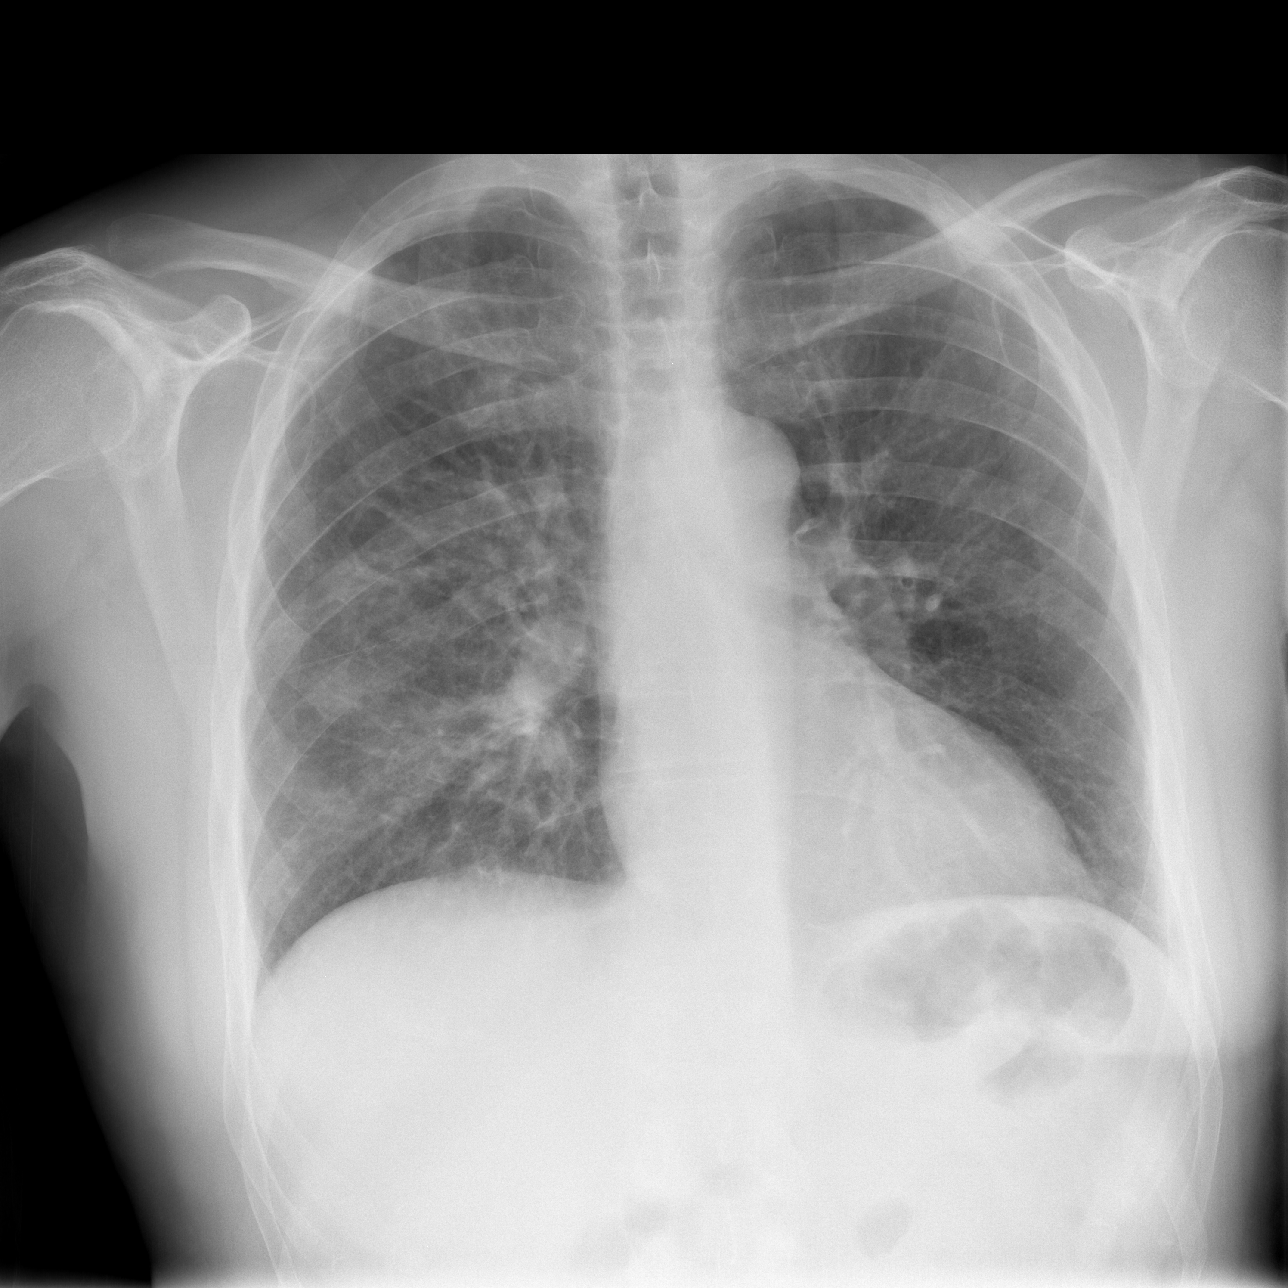

[w ribs ap/pa upper right]
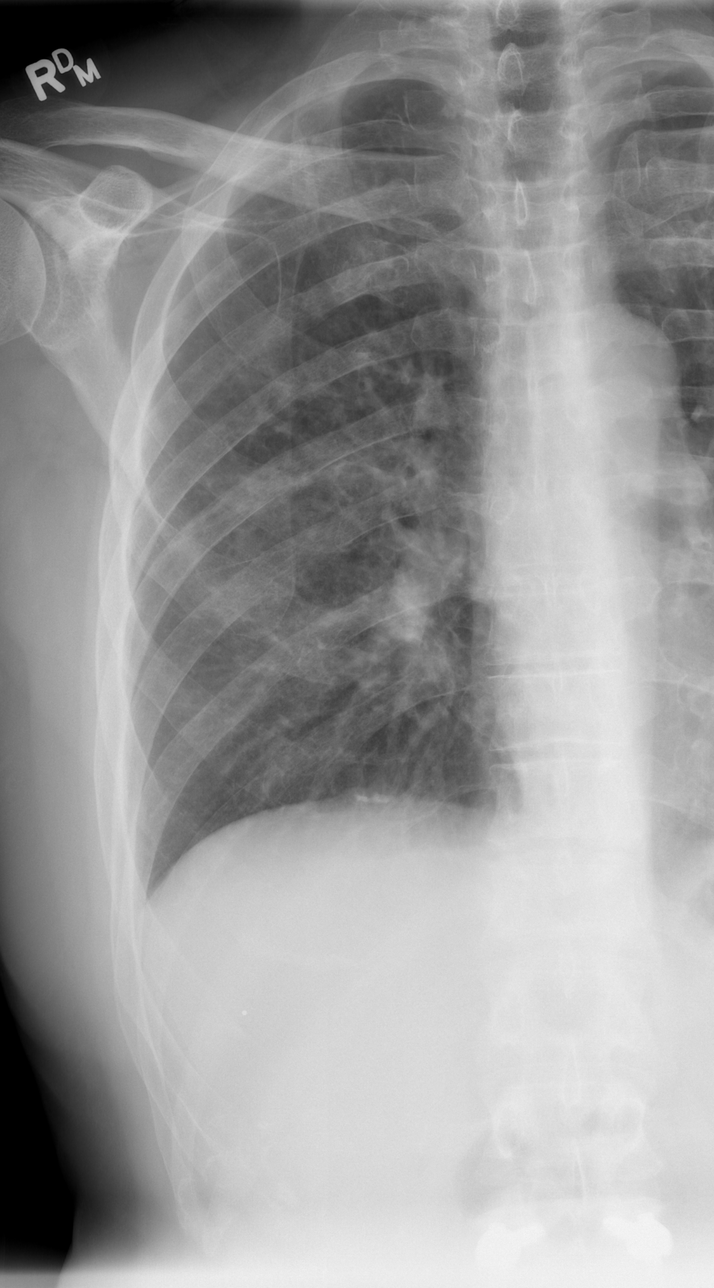

[w ribs ap/pa lower right]
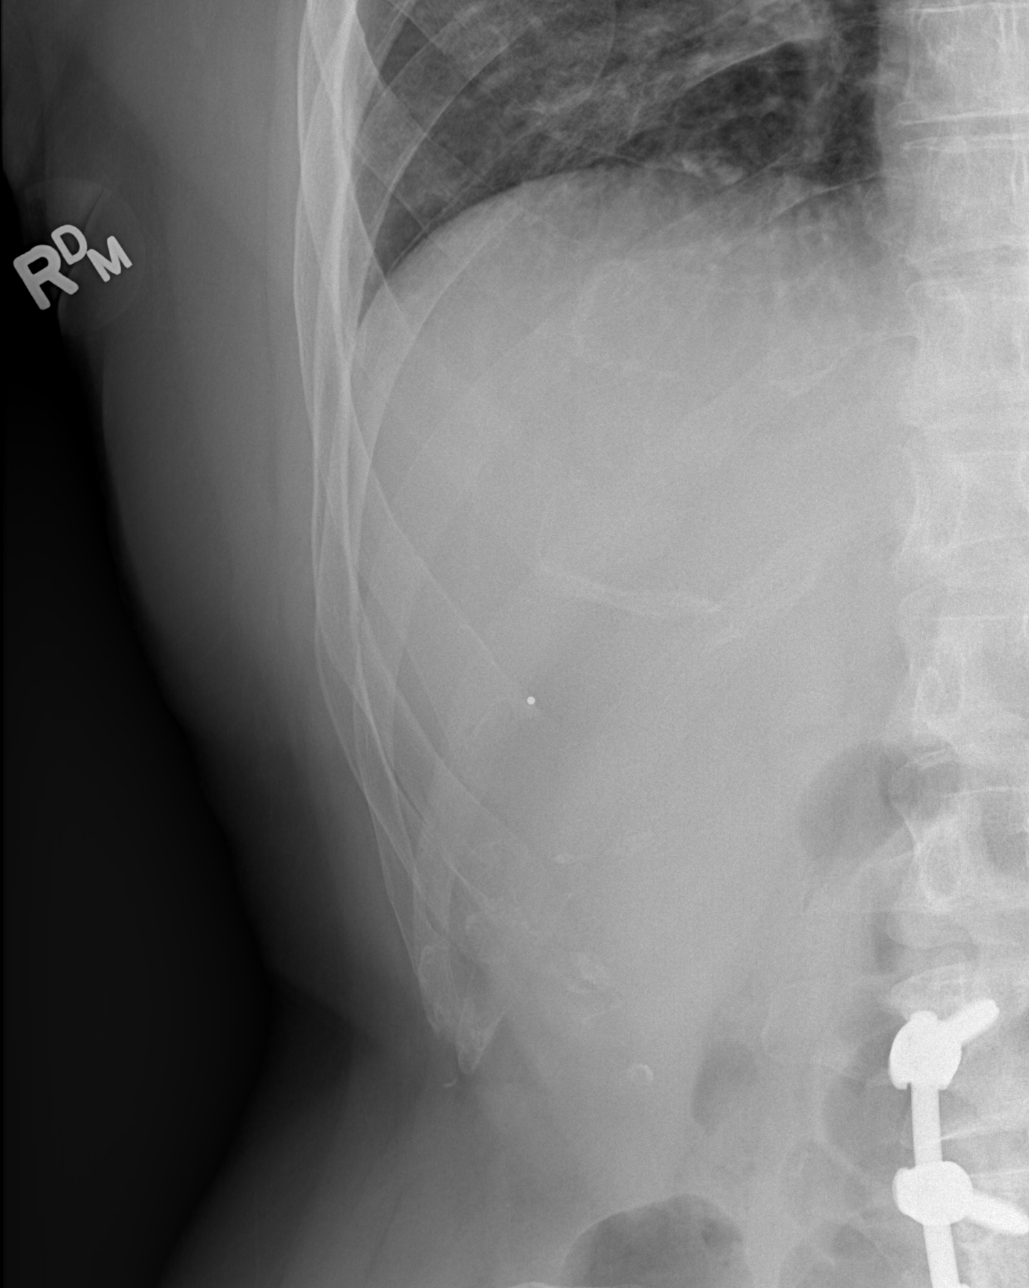

[w ribs oblique right]
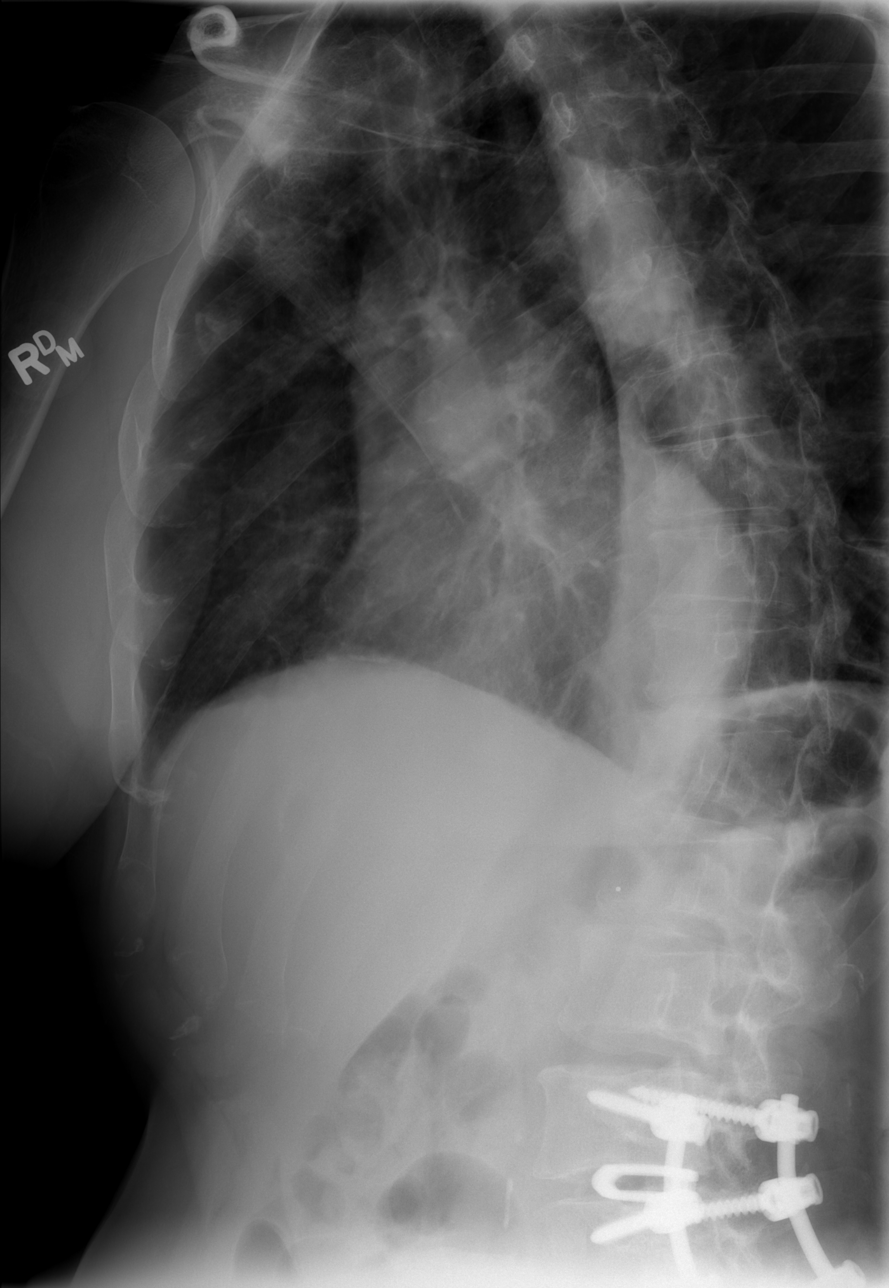

[4 of 4 positions shown; findings below may reference images not displayed]

FINDINGS: There are vague patchy densities in the right perihilar
region that could represent subtle airspace disease and infection.
No evidence for a pneumothorax.  Heart and mediastinum are within
normal limits.  The patient has pedicle screw fixation in the
lumbar spine. There may be subtle cortical irregularity involving
the right seventh rib and eighth ribs.
IMPRESSION: Subtle patchy densities in the right lung could represent
infection.

Mild cortical irregularity involving the right seventh and eighth
ribs.  These could represent minimally-displaced fractures.

## 2015-02-08 ENCOUNTER — Other Ambulatory Visit: Payer: Self-pay | Admitting: *Deleted

## 2015-02-08 ENCOUNTER — Encounter: Payer: Self-pay | Admitting: Vascular Surgery

## 2015-02-08 DIAGNOSIS — I8390 Asymptomatic varicose veins of unspecified lower extremity: Secondary | ICD-10-CM

## 2015-05-10 ENCOUNTER — Encounter: Payer: Self-pay | Admitting: Vascular Surgery

## 2015-05-21 ENCOUNTER — Ambulatory Visit (HOSPITAL_COMMUNITY)
Admission: RE | Admit: 2015-05-21 | Discharge: 2015-05-21 | Disposition: A | Discharge status: Home / Self Care | Payer: BLUE CROSS/BLUE SHIELD | Source: Ambulatory Visit | Attending: Vascular Surgery | Admitting: Vascular Surgery

## 2015-05-21 ENCOUNTER — Encounter: Payer: Self-pay | Admitting: Vascular Surgery

## 2015-05-21 ENCOUNTER — Ambulatory Visit (INDEPENDENT_AMBULATORY_CARE_PROVIDER_SITE_OTHER): Payer: BLUE CROSS/BLUE SHIELD | Admitting: Vascular Surgery

## 2015-05-21 VITALS — BP 116/82 | HR 55 | Temp 97.8°F | Resp 16 | Ht 75.0 in | Wt 202.0 lb

## 2015-05-21 DIAGNOSIS — I8393 Asymptomatic varicose veins of bilateral lower extremities: Secondary | ICD-10-CM | POA: Diagnosis not present

## 2015-05-21 DIAGNOSIS — I83892 Varicose veins of left lower extremities with other complications: Secondary | ICD-10-CM | POA: Insufficient documentation

## 2015-05-21 DIAGNOSIS — I868 Varicose veins of other specified sites: Secondary | ICD-10-CM

## 2015-05-21 DIAGNOSIS — R609 Edema, unspecified: Secondary | ICD-10-CM | POA: Diagnosis present

## 2015-05-21 DIAGNOSIS — I8390 Asymptomatic varicose veins of unspecified lower extremity: Secondary | ICD-10-CM

## 2015-05-21 NOTE — Progress Notes (Signed)
Subjective:     Patient ID: Wayne Arnold, male   DOB: [birth year], 65 y.o.   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 Patient now comes to our vein center because of some aching and burning discomfort in the right ankle area , some mild swelling in the right leg, and some very superficial varicosities which appear to be threatening to bleed. He has no history of DVT thrombophlebitis stasis ulcers or bleeding.  Past Medical History  Diagnosis Date  . PONV (postoperative nausea and vomiting)   . GERD (gastroesophageal reflux disease)     Social History  Substance Use Topics  . Smoking status: Never Smoker   . Smokeless tobacco: Not on file  . Alcohol Use: 1.2 oz/week    2 Glasses of wine per week    History reviewed. No pertinent family history.  Allergies  Allergen Reactions  . Codeine     Stomach upset  . Demerol [Meperidine]     Stomach upset      Filed Vitals:   05/21/15 1434  BP: 116/82  Pulse: 55  Temp: 97.8 F (36.6 C)  Resp: 16  Height: 6\' 3"  (1.905 m)  Weight: 202 lb (91.627 kg)  SpO2: 100%    Body mass index is 25.25 kg/(m^2).           Review of Systems Denies chest pain, dyspnea on exertion, PND, orthopnea, hemoptysis     Objective:   Physical Exam BP 116/82 mmHg  Pulse 55  Temp(Src) 97.8 F (36.6 C)  Resp 16  Ht 6\' 3"  (1.905 m)  Wt 202 lb (91.627 kg)  BMI 25.25 kg/m2  SpO2 100%  Gen.-alert and oriented x3 in no apparent distress HEENT normal for age Lungs no rhonchi or wheezing Cardiovascular regular rhythm no murmurs carotid pulses 3+ palpable no bruits audible Abdomen soft nontender no palpable masses Musculoskeletal free of  major deformities Skin clear -no  rashes Neurologic normal Lower extremities 3+ femoral and dorsalis pedis pulses palpable bilaterally with no edema on the right 1+ edema lower third left leg. Very superficial varicosities about 3-4 mm in diameter in the dermis and epidermis lateral distal aspect left leg. A few smaller varicosities medially over the great saphenous system in the calf. 3+ dorsalis pedis pulse palpable bilaterally.  Today our bilateral venous duplex exam which I reviewed and interpreted. There is no DVT. 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 There is no significant reflux in either small saphenous vein. Tortuous varicosities are noted bilaterally.       Assessment:      status post bilateral great saphenous vein laser ablation procedures now with residual superficial varicosities left lower leg -concern for potential bleeding and burning and aching discomfort     Plan:      only treatment would consist of foam sclerotherapy which we have discussed with patient and he is considering  No role for laser ablation in this patient

## 2015-06-25 ENCOUNTER — Encounter: Payer: Self-pay | Admitting: *Deleted

## 2015-07-04 ENCOUNTER — Ambulatory Visit (INDEPENDENT_AMBULATORY_CARE_PROVIDER_SITE_OTHER): Payer: BLUE CROSS/BLUE SHIELD | Admitting: *Deleted

## 2015-07-04 ENCOUNTER — Encounter: Payer: Self-pay | Admitting: Vascular Surgery

## 2015-07-04 DIAGNOSIS — I83892 Varicose veins of left lower extremities with other complications: Secondary | ICD-10-CM

## 2015-07-04 NOTE — Progress Notes (Signed)
X=.3% Sotradecol administered with a 27g butterfly.  Patient received a total of 6cc.  Treated all areas on left lower leg where there was a bleed and where vessels look like they could bleed. Easy access. Tol well. Will follow prn.  Photos: No.  Compression stockings applied: Yes.  as well as cotton balls over bleed sites and ace wrap over the stockings.

## 2016-05-20 HISTORY — PX: REPLACEMENT TOTAL KNEE: SUR1224

## 2017-04-19 ENCOUNTER — Encounter: Payer: Self-pay | Admitting: Neurology

## 2017-04-19 ENCOUNTER — Encounter: Payer: Self-pay | Admitting: *Deleted

## 2017-04-20 ENCOUNTER — Encounter: Payer: Self-pay | Admitting: Neurology

## 2017-04-20 ENCOUNTER — Ambulatory Visit (INDEPENDENT_AMBULATORY_CARE_PROVIDER_SITE_OTHER): Payer: Medicare Other | Admitting: Neurology

## 2017-04-20 VITALS — BP 107/75 | HR 64 | Ht 75.0 in | Wt 194.0 lb

## 2017-04-20 DIAGNOSIS — W19XXXA Unspecified fall, initial encounter: Secondary | ICD-10-CM

## 2017-04-20 DIAGNOSIS — H93A9 Pulsatile tinnitus, unspecified ear: Secondary | ICD-10-CM

## 2017-04-20 DIAGNOSIS — D333 Benign neoplasm of cranial nerves: Secondary | ICD-10-CM

## 2017-04-20 DIAGNOSIS — H5461 Unqualified visual loss, right eye, normal vision left eye: Secondary | ICD-10-CM | POA: Diagnosis not present

## 2017-04-20 DIAGNOSIS — R51 Headache: Secondary | ICD-10-CM

## 2017-04-20 DIAGNOSIS — H9071 Mixed conductive and sensorineural hearing loss, unilateral, right ear, with unrestricted hearing on the contralateral side: Secondary | ICD-10-CM | POA: Diagnosis not present

## 2017-04-20 DIAGNOSIS — R519 Headache, unspecified: Secondary | ICD-10-CM

## 2017-04-20 DIAGNOSIS — R42 Dizziness and giddiness: Secondary | ICD-10-CM | POA: Diagnosis not present

## 2017-04-20 DIAGNOSIS — I671 Cerebral aneurysm, nonruptured: Secondary | ICD-10-CM | POA: Diagnosis not present

## 2017-04-20 DIAGNOSIS — H93A1 Pulsatile tinnitus, right ear: Secondary | ICD-10-CM

## 2017-04-20 MED ORDER — ONDANSETRON HCL 4 MG PO TABS: 4.0000 mg | ORAL_TABLET | Freq: Three times a day (TID) | ORAL | 11 refills | Status: DC

## 2017-04-20 MED ORDER — SUMATRIPTAN SUCCINATE 6 MG/0.5ML ~~LOC~~ SOLN: SUBCUTANEOUS | 6 refills | Status: DC

## 2017-04-20 MED ORDER — VERAPAMIL HCL ER 100 MG PO CP24: 100.0000 mg | ORAL_CAPSULE | Freq: Every day | ORAL | 11 refills | Status: DC

## 2017-04-20 MED ORDER — SUMATRIPTAN SUCCINATE 11 MG/NOSEPC NA EXHP: 1.0000 | INHALANT_POWDER | Freq: Once | NASAL | 11 refills | Status: DC

## 2017-04-20 NOTE — Progress Notes (Signed)
[facility]    Provider:  Dr Jaynee Eagles Referring Provider: Hassell Done MD Primary Care Physician:  Concepcion Elk, MD  CC:  Vertigo  HPI:  Wayne Arnold is a 67 y.o. male here as a referral from Dr. Ishmael Holter for dizziness and giddiness.  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  Onset of vertigo January 6 in 2017 the day after retirement. He woke up and felt slight dizziness, 7 days later it happened again, it was nausea and room spinning. He saw a specialist in meniere's and said he did not have it. In April 2017 the symptoms went away for a year then started again in May of 2018 and off anf on since then. Last episode was 2 weeks ago. He has been to multiple doctors and tried multiple preventatives in acute management which have not worked.  Dix-Hallpike maneuver produced no vertigo with either ear down.  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  He apparently has done physical therapy at home with no improvement.  Patient has seen 2 ENT doctors Dr. Carolanne Grumbling and Dr. Freddy Jaksch in [place] as well as a doctor at [facility] who referred him here.  Episodes can last over an hour.  They are episodic, may not have one for 13 months.  Described as spinning of the room.  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  He is tried diuretic therapy in the past. The nortriptyline helped a little but episodes became more frequent, verapamll possibly helped. Symptoms acocmpanied by ear ache. The room spins, nausea, imbalance, vomiting and sweating. No hx of migraines. Denies any headaches with the episodes. No light or sound sensitivity or migrainous symptoms. He gets irritable. No dysequilibrium. Hearing loss of the right ear, he can feel his pulse at night pulsatile tinnitus. He has had some right sided headache but not migrainous.  Medications tried: Imitrex during episodes, nortriptyline, verapamil.  Valium, meclizine,  diazepam and Imitrex did not help his acute symptoms.  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  Also tried prednisone.  Reviewed notes, labs and imaging from outside physicians, which showed:  MRi brain 2017: Diffusion imaging does not show any acute or subacute infarction. The brainstem shows some chronic small-vessel ischemic change affecting the pons. No focal cerebellar insult. CP angle regions are normal. Seventh and eighth nerve complexes are normal. No vestibular schwannoma. No evidence of neuritis. No fluid in the middle ears or mastoids.  Cerebral hemispheres show mild chronic small-vessel change of the white matter. No cortical or large vessel territory infarction. No mass lesion, hemorrhage, hydrocephalus or extra-axial fluid collection. No abnormal supratentorial enhancement.  IMPRESSION: No specific cause of the presenting symptoms is identified. Chronic small-vessel changes affecting the pons in the cerebral hemispheric white matter, mild in degree but premature for age. No vestibular schwannoma.  Reviewed notes.  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  Onset was January 2017 with intermittent episodes of spinning vertigo with right ear fullness.  Valium and meclizine did not help his acute symptoms.  MRI of the brain by report was "okay".  He briefly saw a physician in [place], Dr. Freddy Jaksch.  He was evaluated by ear nose and throat.  Vestibular migraine was a possibility.  He was started on nortriptyline 50  mg a day still having a moderate number of episodes though less in severity.  Exam showed normal external canals and tympanic membranes bilaterally.  He was switched off nortriptyline to verapamil.  Physical exam normal, exterior nose normal, larynx normal, nasopharynx normal, in fact the entire  exam was normal.  Dix-Hallpike did not produce symptoms.  Review of Systems: Patient complains of symptoms per HPI as well as the following symptoms: dizziness, nausea. Pertinent negatives and positives per HPI. All others negative.   Social History   Socioeconomic History  . Marital status: Married    Spouse name: Not on file  . Number of children: Not on file  . Years of education: Not on file  . Highest education level: Not on file  Social Needs  . Financial resource strain: Not on file  . Food insecurity - worry: Not on file  . Food insecurity - inability: Not on file  . Transportation needs - medical: Not on file  . Transportation needs - non-medical: Not on file  Occupational History  . Not on file  Tobacco Use  . Smoking status: Never Smoker  . Smokeless tobacco: Never Used  Substance and Sexual Activity  . Alcohol use: Yes    Comment: occasional  . Drug use: No  . Sexual activity: Yes  Other Topics Concern  . Not on file  Social History Narrative  . Not on file    Family History  Problem Relation Age of Onset  . Alzheimer's disease Mother   . COPD Father   . Diabetes Father   . Glaucoma Paternal Grandmother     Past Medical History:  Diagnosis Date  . Factor 5 Leiden mutation, heterozygous ([place])   . GERD (gastroesophageal reflux disease)   . PONV (postoperative nausea and vomiting)   . Primary open angle glaucoma of both eyes   . Pulmonary embolism ([place])   . Vertigo     Past Surgical History:  Procedure Laterality Date  . BACK SURGERY  2012   two spinal fusions  . kidney stone removal    . 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 SHOULDER ARTHROSCOPY      Current Outpatient Medications  Medication Sig Dispense Refill  . apixaban (ELIQUIS) 2.5 MG TABS tablet Take 2.5 mg by mouth 2 (two) times daily.    . Tafluprost 0.0015 % SOLN Apply 1 drop to eye daily.    . timolol (BETIMOL) 0.25 % ophthalmic solution 1-2 drops 2 (two) times daily.    .  verapamil (VERELAN PM) 100 MG 24 hr capsule Take 1 capsule (100 mg total) by mouth daily. For 30 days 30 capsule 11  . ondansetron (ZOFRAN) 4 MG tablet Take 1 tablet (4 mg total) by mouth every 8 (eight) hours. 90 tablet 11  . SUMAtriptan (IMITREX) 6 MG/0.5ML SOLN injection Inject 0.57mLs (6mg ) into the skin for vertigo or migraine. May repeat in 2 hours. Maximum 2 injections in one day 10 vial 6   No current facility-administered medications for this visit.     Allergies as of 04/20/2017 - Review Complete 04/20/2017  Allergen Reaction Noted  . Codeine Nausea Only 03/01/2012  . Meperidine Nausea Only 03/01/2012    Vitals: BP 107/75 (Patient Position: Standing)   Pulse 64   Ht 6\' 3"  (1.905 m)   Wt 194 lb (88 kg)   BMI 24.25 kg/m  Last Weight:  Wt Readings from Last 1 Encounters:  04/20/17 194 lb (88 kg)   Last Height:  Ht Readings from Last 1 Encounters:  04/20/17 6\' 3"  (1.905 m)   Physical exam: Exam: Gen: appears distressed        CV: RRR, no MRG. No Carotid Bruits. No peripheral edema, warm, nontender Eyes: Conjunctivae clear without exudates or hemorrhage  Neuro: Detailed Neurologic Exam  Speech:    Speech is normal; fluent and spontaneous with normal comprehension.  Cognition:    The patient is oriented to person, place, and time;     recent and remote memory intact;     language fluent;     normal attention, concentration,     fund of knowledge Cranial Nerves:    The pupils are equal, round, and reactive to light. The fundi are normal and spontaneous venous pulsations are present. Visual fields are full to finger confrontation. Extraocular movements are intact. Trigeminal sensation is intact and the muscles of mastication are normal. The face is symmetric. The palate elevates in the midline. Hearing intact. Voice is normal. Shoulder shrug is normal. The tongue has normal motion without fasciculations.   Coordination:    Normal finger to nose and heel to shin.  Normal rapid alternating movements.   Gait:    Not ataxic  Motor Observation:    No asymmetry, no atrophy, and no involuntary movements noted. Tone:    Normal muscle tone.    Posture:    Posture is normal. normal erect    Strength:    Strength is V/V in the upper and lower limbs.      Sensation: intact to LT     Reflex Exam:  DTR's:    Deep tendon reflexes in the upper and lower extremities are symmetrical bilaterally.   Toes:    The toes are downgoing bilaterally.   Clonus:    Clonus is absent.       Assessment/Plan:  Patient with intractable vertigo, vision loss right eye, pulsatile tinnitus right ear, hearing loss right ear. Worsening, has seen multiple ENTs, had therapy in the past (will resend), medications not helping. Significant impairment.   - Patient does not have a history of migraines, he had one brief slight headache with ear pain and there were no migrainous features. But he does report some right-sided head "fuzziness" unclear if could classify this as a headache. I think this is unlikely vestibular migraines but agree the case is perplexing. We will try to treat as Vestibular migraines though and see if he responds.  - MRI brain w/wo contrast for worsening vertigo, falls, hearing loss: eval for vestibular schwanomma or other lesions - MRA head for pulsatile tinnitus to eval for aneurysm - For acute onset: Imitrex nasal powder or injectables - Vestibular therapy - Continue Verapamil - Ondansetron three times a day - Would try an anti-epilepsy medication next maybe gabapentin or depakote (has glaucoma, wouldn;t use Topiramate as can worsen this) - Recommend cardiac evaluation per pcp  Orders Placed This Encounter  Procedures  . MR BRAIN W WO CONTRAST  . MR MRA HEAD WO CONTRAST  . Basic Metabolic Panel  . C-reactive protein  . Sedimentation rate  . Ambulatory referral to Physical Therapy   To prevent or relieve headaches, try the following: Cool  Compress. Lie down and place a cool compress on your head.  Avoid headache triggers. If certain foods or odors seem to have triggered your migraines in the past, avoid them. A headache diary might help you identify triggers.  Include physical activity in your daily routine. Try a daily walk or  other moderate aerobic exercise.  Manage stress. Find healthy ways to cope with the stressors, such as delegating tasks on your to-do list.  Practice relaxation techniques. Try deep breathing, yoga, massage and visualization.  Eat regularly. Eating regularly scheduled meals and maintaining a healthy diet might help prevent headaches. Also, drink plenty of fluids.  Follow a regular sleep schedule. Sleep deprivation might contribute to headaches Consider biofeedback. With this mind-body technique, you learn to control certain bodily functions - such as muscle tension, heart rate and blood pressure - to prevent headaches or reduce headache pain.    Proceed to emergency room if you experience new or worsening symptoms or symptoms do not resolve, if you have new neurologic symptoms or if headache is severe, or for any concerning symptom.   Provided education and documentation from American headache Society toolbox including articles on: chronic migraine medication overuse headache, chronic migraines, prevention of migraines, behavioral and other nonpharmacologic treatments for headache.  Cc: Concepcion Elk, MD, Dr. Saralyn Pilar, [facility] [address] [place], [place] [address]  Phone [phone] Fax [phone]

## 2017-04-20 NOTE — Patient Instructions (Addendum)
MRI brain w/wo contrast and MRA head Lab For acute onset Imitrex nasal powder or injectables Vestibular therapy  Continue Verapamil Ondansetron three times a day Would try an anti-epilepsy medication next (Gabapentin or Depakote)  Ondansetron tablets What is this medicine? ONDANSETRON (on DAN se tron) is used to treat nausea and vomiting caused by chemotherapy. It is also used to prevent or treat nausea and vomiting after surgery. This medicine may be used for other purposes; ask your health care provider or pharmacist if you have questions. COMMON BRAND NAME(S): Zofran What should I tell my health care provider before I take this medicine? They need to know if you have any of these conditions: -heart disease -history of irregular heartbeat -liver disease -low levels of magnesium or potassium in the blood -an unusual or allergic reaction to ondansetron, granisetron, other medicines, foods, dyes, or preservatives -pregnant or trying to get pregnant -breast-feeding How should I use this medicine? Take this medicine by mouth with a glass of water. Follow the directions on your prescription label. Take your doses at regular intervals. Do not take your medicine more often than directed. Talk to your pediatrician regarding the use of this medicine in children. Special care may be needed. Overdosage: If you think you have taken too much of this medicine contact a poison control center or emergency room at once. NOTE: This medicine is only for you. Do not share this medicine with others. What if I miss a dose? If you miss a dose, take it as soon as you can. If it is almost time for your next dose, take only that dose. Do not take double or extra doses. What may interact with this medicine? Do not take this medicine with any of the following medications: -apomorphine -certain medicines for fungal infections like fluconazole, itraconazole, ketoconazole, posaconazole,  voriconazole -cisapride -dofetilide -dronedarone -pimozide -thioridazine -ziprasidone This medicine may also interact with the following medications: -carbamazepine -certain medicines for depression, anxiety, or psychotic disturbances -fentanyl -linezolid -MAOIs like Carbex, Eldepryl, Marplan, Nardil, and Parnate -methylene blue (injected into a vein) -other medicines that prolong the QT interval (cause an abnormal heart rhythm) -phenytoin -rifampicin -tramadol This list may not describe all possible interactions. Give your health care provider a list of all the medicines, herbs, non-prescription drugs, or dietary supplements you use. Also tell them if you smoke, drink alcohol, or use illegal drugs. Some items may interact with your medicine. What should I watch for while using this medicine? Check with your doctor or health care professional right away if you have any sign of an allergic reaction. What side effects may I notice from receiving this medicine? Side effects that you should report to your doctor or health care professional as soon as possible: -allergic reactions like skin rash, itching or hives, swelling of the face, lips or tongue -breathing problems -confusion -dizziness -fast or irregular heartbeat -feeling faint or lightheaded, falls -fever and chills -loss of balance or coordination -seizures -sweating -swelling of the hands or feet -tightness in the chest -tremors -unusually weak or tired Side effects that usually do not require medical attention (report to your doctor or health care professional if they continue or are bothersome): -constipation or diarrhea -headache This list may not describe all possible side effects. Call your doctor for medical advice about side effects. You may report side effects to FDA at 1-800-FDA-1088. Where should I keep my medicine? Keep out of the reach of children. Store between 2 and 30 degrees C (36 and 86  degrees F).  Throw away any unused medicine after the expiration date. NOTE: This sheet is a summary. It may not cover all possible information. If you have questions about this medicine, talk to your doctor, pharmacist, or health care provider.  2018 Elsevier/Gold Standard (2012-12-14 16:27:45)  Sumatriptan nasal powder What is this medicine? SUMATRIPTAN (soo ma TRIP tan) is used to treat migraines with or without aura. An aura is a strange feeling or visual disturbance that warns you of an attack. It is not used to prevent migraines. This medicine may be used for other purposes; ask your health care provider or pharmacist if you have questions. COMMON BRAND NAME(S): ONZETRA What should I tell my health care provider before I take this medicine? They need to know if you have any of these conditions: -circulation problems in fingers and toes -diabetes -heart disease -high blood pressure -high cholesterol -history of irregular heartbeat -history of stroke -kidney disease -liver disease -postmenopausal or surgical removal of uterus and ovaries -seizures -smoke tobacco -stomach or intestine problems -an unusual or allergic reaction to sumatriptan, other medicines, foods, dyes, or preservatives -pregnant or trying to get pregnant -breast-feeding How should I use this medicine? This medicine is for use in the nose. Follow the directions on the prescription label. This medicine is taken at the first symptoms of a migraine. It is not for everyday use. Do not take your medicine more often than directed. Talk to your pediatrician regarding the use of this medicine in children. Special care may be needed. Overdosage: If you think you have taken too much of this medicine contact a poison control center or emergency room at once. NOTE: This medicine is only for you. Do not share this medicine with others. What if I miss a dose? This does not apply; this medicine is not for regular use. What may interact  with this medicine? Do not take this medicine with any of the following medicines: -cocaine -ergot alkaloids like dihydroergotamine, ergonovine, ergotamine, methylergonovine -feverfew -MAOIs like Carbex, Eldepryl, Marplan, Nardil, and Parnate -other medicines for migraine headache like almotriptan, eletriptan, frovatriptan, naratriptan, rizatriptan, zolmitriptan -tryptophan This medicine may also interact with the following medications: -certain medicines for depression, anxiety, or psychotic disturbances This list may not describe all possible interactions. Give your health care provider a list of all the medicines, herbs, non-prescription drugs, or dietary supplements you use. Also tell them if you smoke, drink alcohol, or use illegal drugs. Some items may interact with your medicine. What should I watch for while using this medicine? Only take this medicine for a migraine headache. Take it if you get warning symptoms or at the start of a migraine attack. It is not for regular use to prevent migraine attacks. You may get drowsy or dizzy. Do not drive, use machinery, or do anything that needs mental alertness until you know how this medicine affects you. Do not stand or sit up quickly, especially if you are an older patient. This reduces the risk of dizzy or fainting spells. Alcohol may interfere with the effect of this medicine. Avoid alcoholic drinks. Smoking cigarettes may increase the risk of heart-related side effects from using this medicine. If you take migraine medicines for 10 or more days a month, your migraines may get worse. Keep a diary of headache days and medicine use. Contact your healthcare professional if your migraine attacks occur more frequently. What side effects may I notice from receiving this medicine? Side effects that you should report to your doctor or  health care professional as soon as possible: -allergic reactions like skin rash, itching or hives, swelling of the  face, lips, or tongue -bloody or watery diarrhea -hallucination, loss of contact with reality -pain, tingling, numbness in the face, hands, or feet -seizures -signs and symptoms of a blood clot such as breathing problems; changes in vision; chest pain; severe, sudden headache; pain, swelling, warmth in the leg; trouble speaking; sudden numbness or weakness of the face, arm, or leg -signs and symptoms of a dangerous change in heartbeat or heart rhythm like chest pain; dizziness; fast or irregular heartbeat; palpitations, feeling faint or lightheaded; falls; breathing problems -signs and symptoms of a stroke like changes in vision; confusion; trouble speaking or understanding; severe headaches; sudden numbness or weakness of the face, arm, or leg; trouble walking; dizziness; loss of balance or coordination -stomach pain Side effects that usually do not require medical attention (report to your doctor or health care professional if they continue or are bothersome): -changes in taste -facial flushing -headache -muscle cramps -muscle pain -nausea, vomiting -weak or tired This list may not describe all possible side effects. Call your doctor for medical advice about side effects. You may report side effects to FDA at 1-800-FDA-1088. Where should I keep my medicine? Keep out of the reach of children. Store at room temperature between 15 and 30 degrees C (59 and 86 degrees F). Throw away any unused medicine after the expiration date. NOTE: This sheet is a summary. It may not cover all possible information. If you have questions about this medicine, talk to your doctor, pharmacist, or health care provider.  2018 Elsevier/Gold Standard (2015-04-11 08:54:41)

## 2017-04-21 LAB — BASIC METABOLIC PANEL
BUN/Creatinine Ratio: 24 (ref 10–24)
BUN: 24 mg/dL (ref 8–27)
CO2: 21 mmol/L (ref 20–29)
Calcium: 9.4 mg/dL (ref 8.6–10.2)
Chloride: 107 mmol/L — ABNORMAL HIGH (ref 96–106)
Creatinine, Ser: 1.01 mg/dL (ref 0.76–1.27)
GFR, EST AFRICAN AMERICAN: 90 mL/min/{1.73_m2} (ref >59)
GFR, EST NON AFRICAN AMERICAN: 78 mL/min/{1.73_m2} (ref >59)
Glucose: 92 mg/dL (ref 65–99)
POTASSIUM: 5.1 mmol/L (ref 3.5–5.2)
SODIUM: 143 mmol/L (ref 134–144)

## 2017-04-26 ENCOUNTER — Telehealth: Payer: Self-pay | Admitting: *Deleted

## 2017-04-26 NOTE — Telephone Encounter (Signed)
Called patient and confirmed his PCP Dr. Loistine Simas with [facility] on [address].   Confirmed fax number for Dr. Eilleen Kempf office via telephone call to [facility].   Faxed 04/20/17 office notes & cover letter to Dr. Loistine Simas office. Cover letter stated the following:  Dr. Loistine Simas, I saw patient on 04/20/17 for his vertigo. Symptoms would be atypical for cardiac etiology but still please consider a cardiac eval or holter monitor for patient. Thanks,  Dr. Jaynee Eagles Neurology.   Received a receipt of confirmation.

## 2017-04-26 NOTE — Telephone Encounter (Signed)
-----   Message from Melvenia Beam, MD sent at 04/20/2017  1:43 PM EST ----- Romelle Starcher, can you confirm patient;s primary care and fax to them, I can;t route via epic. On the fax please state the following:  Dr. Loistine Simas: I saw patient today for his vertigo. Symptoms would be atypical for cardiac etiology but still please consider a cardiac eval or holter monitor for patient. Thanks Dr. Jaynee Eagles Neurology

## 2017-04-27 ENCOUNTER — Ambulatory Visit: Payer: Medicare Other | Attending: Neurology | Admitting: Physical Therapy

## 2017-04-27 ENCOUNTER — Other Ambulatory Visit: Payer: Self-pay

## 2017-04-27 ENCOUNTER — Encounter: Payer: Self-pay | Admitting: Physical Therapy

## 2017-04-27 DIAGNOSIS — R42 Dizziness and giddiness: Secondary | ICD-10-CM | POA: Diagnosis not present

## 2017-04-27 NOTE — Therapy (Signed)
[facility] [address]  [place], [place], [address] Phone: [phone]   Fax:  [phone]  Physical Therapy Treatment  Patient Details  Name: Wayne Arnold MRN: [id number] Date of Birth: 03-Sep-1950 Referring Provider: Dr. Sarina Ill   Encounter Date: 04/27/2017  PT End of Session - 04/27/17 1642    Visit Number  1    Date for PT Re-Evaluation  06/08/17    Authorization Type  Medicare    PT Start Time  1448    PT Stop Time  1515    PT Time Calculation (min)  27 min    Activity Tolerance  Patient tolerated treatment well    Behavior During Therapy  [facility] for tasks assessed/performed       Past Medical History:  Diagnosis Date  . Factor 5 Leiden mutation, heterozygous ([place])   . GERD (gastroesophageal reflux disease)   . PONV (postoperative nausea and vomiting)   . Primary open angle glaucoma of both eyes   . Pulmonary embolism ([place])   . Vertigo     Past Surgical History:  Procedure Laterality Date  . BACK SURGERY  2012   two spinal fusions  . kidney stone removal    . 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 SHOULDER ARTHROSCOPY      There were no vitals filed for this visit.  Subjective Assessment - 04/27/17 1450    Subjective  No symptoms of Vertigo in the past 3 weeks, but does continuously worry about it. Reports 2 types of dizziness: 1 type of dizziness: full head, becomes unsteady, takes an hour to resolve; 2nd type: room spinning, lasts 1 hour, N&V. Last episode of 2nd type 3 weeks ago. 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 Goes for MRI and MRA this Saturday. Ruled out Meniere's disease by MD. Has been taking medication for vertigo related migraines - with some benefit.     Pertinent History  R TKA, disc fusion - unknown levels - reports to have another surgery in March 2019    Diagnostic tests  MRI and MRA on 05/01/17    Patient Stated Goals  find out what is causing dizziness and  terminate symptoms    Currently in Pain?  No/denies    Multiple Pain Sites  No         [facility] PT Assessment - 04/27/17 0001      Assessment   Medical Diagnosis  Vertigo    Referring Provider  Dr. Sarina Ill    Onset Date/Surgical Date  -- January 2017    Next MD Visit  -- Mid March    Prior Therapy  yes      Precautions   Precautions  None      Balance Screen   Has the patient fallen in the past 6 months  No    Has the patient had a decrease in activity level because of a fear of falling?   No    Is the patient reluctant to leave their home because of a fear of falling?   No      Prior Function   Level of Independence  Independent    Vocation  Retired      Associate Professor   Overall Cognitive Status  Within Functional Limits for tasks assessed      Posture/Postural Control   Posture/Postural Control  No significant limitations      ROM / Strength   AROM /  PROM / Strength  AROM      AROM   Overall AROM   Within functional limits for tasks performed    Overall AROM Comments  C-Spine         Vestibular Assessment - 04/27/17 0001      Vestibular Assessment   General Observation  wearing glasses and B hearing aids      Symptom Behavior   Type of Dizziness  Spinning    Frequency of Dizziness  7-10 days    Duration of Dizziness  1 hour    Aggravating Factors  No known aggravating factors    Relieving Factors  No known relieving factors      Occulomotor Exam   Occulomotor Alignment  Normal glasses    Spontaneous  Absent    Smooth Pursuits  Intact    Saccades  Intact B double beat      Positional Testing   Dix-Hallpike  Dix-Hallpike Right;Dix-Hallpike Left      Dix-Hallpike Right   Dix-Hallpike Right Duration  30 seconds    Dix-Hallpike Right Symptoms  No nystagmus      Dix-Hallpike Left   Dix-Hallpike Left Duration  30 seconds    Dix-Hallpike Left Symptoms  No nystagmus      Cognition   Cognition Orientation Level  Oriented x 4                       PT Education - 04/27/17 1641    Education provided  Yes    Education Details  exam findings, likely to refer over to neuro rehab    Person(s) Educated  Patient    Methods  Explanation    Comprehension  Verbalized understanding          PT Long Term Goals - 04/27/17 1707      PT LONG TERM GOAL #1   Title  Goals to be established upon further assessment as indicated    Status  New            Plan - 04/27/17 1648    Clinical Impression Statement  Wayne Arnold is a pleasant 67 y/o male presenting to OPPT today regarding intermittent vertigo symptoms that have been persistent for ~1 year. Per patient report, symptoms are inconsistent with no known aggravating or relieving factors. No symptoms today and unable to provoke any symptoms with testing. Did have B double beat with saccades, however, no nystagmus or feelings of onset of dizziness/vertigo. Some "discomfort/disorientation" when taken back into Dix-Hallpike when head was vertical but no symptoms with true Dix-Hallpike positioning on either side. Patient to have MRI and MRA this Saturday. After results of imaging will likely plan to refer patient over to the Neuro Rehab location for more specialized vestibular treatment if indicated.     Clinical Presentation  Stable    Clinical Decision Making  Low    Rehab Potential  Good    PT Frequency  2x / week    PT Duration  6 weeks    PT Treatment/Interventions  ADLs/Self Care Home Management;Canalith Repostioning;Balance training;Therapeutic activities;Therapeutic exercise;Manual techniques;Visual/perceptual remediation/compensation;Patient/family education;Vestibular;Neuromuscular re-education    Consulted and Agree with Plan of Care  Patient       Patient will benefit from skilled therapeutic intervention in order to improve the following deficits and impairments:  Dizziness  Visit Diagnosis: Dizziness and giddiness     Problem List Patient  Active Problem List   Diagnosis Date Noted  . Vertigo 04/20/2017  .  Varicose veins of left lower extremity with complications 20/23/3435  . Abnormal ECG/ST segment depression 03/02/2012  . DVT, lower extremity, recurrent ([place]) 03/02/2012  . Pleuritic chest pain 03/02/2012  . Acute respiratory failure with hypoxia ([place]) 03/02/2012  . 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 Glaucoma 03/01/2012  . Low back pain 03/01/2012    Lanney Gins, PT, DPT 04/27/17 5:56 PM  [facility] [address]  [place], [place], [address] Phone: [phone]   Fax:  [phone]  Name: Wayne Arnold MRN: [id number] Date of Birth: Jul 09, 1950

## 2017-05-01 ENCOUNTER — Ambulatory Visit
Admission: RE | Admit: 2017-05-01 | Discharge: 2017-05-01 | Disposition: A | Discharge status: Home / Self Care | Payer: Medicare Other | Source: Ambulatory Visit | Attending: Neurology | Admitting: Neurology

## 2017-05-01 DIAGNOSIS — H5461 Unqualified visual loss, right eye, normal vision left eye: Secondary | ICD-10-CM

## 2017-05-01 DIAGNOSIS — H9071 Mixed conductive and sensorineural hearing loss, unilateral, right ear, with unrestricted hearing on the contralateral side: Secondary | ICD-10-CM | POA: Diagnosis not present

## 2017-05-01 DIAGNOSIS — R51 Headache: Secondary | ICD-10-CM

## 2017-05-01 DIAGNOSIS — H93A9 Pulsatile tinnitus, unspecified ear: Secondary | ICD-10-CM

## 2017-05-01 DIAGNOSIS — R42 Dizziness and giddiness: Secondary | ICD-10-CM

## 2017-05-01 DIAGNOSIS — I671 Cerebral aneurysm, nonruptured: Secondary | ICD-10-CM

## 2017-05-01 DIAGNOSIS — W19XXXA Unspecified fall, initial encounter: Secondary | ICD-10-CM

## 2017-05-01 DIAGNOSIS — H93A1 Pulsatile tinnitus, right ear: Secondary | ICD-10-CM

## 2017-05-01 DIAGNOSIS — R519 Headache, unspecified: Secondary | ICD-10-CM

## 2017-05-01 DIAGNOSIS — D333 Benign neoplasm of cranial nerves: Secondary | ICD-10-CM

## 2017-05-01 MED ORDER — GADOBENATE DIMEGLUMINE 529 MG/ML IV SOLN
18.0000 mL | Freq: Once | INTRAVENOUS | Status: AC | PRN
  Administered 2017-05-01: 18 mL via INTRAVENOUS

## 2017-05-03 ENCOUNTER — Telehealth: Payer: Self-pay | Admitting: *Deleted

## 2017-05-03 NOTE — Telephone Encounter (Signed)
Received letter from [employer] stating that there is a quantity limit on sumatriptan, which is 12 injections (6 mL) per 30 days.   Called pharmacy and they stated that when they tried to run the Sumatriptan insurance came back with a limit of 1 kit (2 injections 0.5 mL each) per 30 days which is significantly less than what insurance included in the letter. Each kit is $55.

## 2017-05-03 NOTE — Telephone Encounter (Addendum)
Spoke with the pharmacy again. The price when ran through for a month is over $300. The first time it was ran through for 1 kit (2 injections) and that cost $55. The patient did fill the first 2 injections (1 kit).   Called patient and discussed Sumatriptan. He stated he has not used the injections yet and his goal is not to have to use them. He will call the office if he uses them and they do not work or if the price is too high going forward. He had no questions or concerns.

## 2017-05-03 NOTE — Telephone Encounter (Signed)
Called [employer] and discussed quantity limit on Sumatriptan. Our current order states that patient's max is 5 mL per 30 days. Spoke with [place]. She stated that 5 mL is less than the patient's allowed quantity limit per month. She stated that the pharmacy tried to run 2 injections per 2 days. Will call pharmacy and discuss.

## 2017-05-04 ENCOUNTER — Telehealth: Payer: Self-pay | Admitting: *Deleted

## 2017-05-04 NOTE — Telephone Encounter (Addendum)
Spoke with patient and informed him that his MRI brain & MRA head are both unremarkable. He verbalized understanding and appreciation. Encouraged to call if any questions arise.   ----- Message from Melvenia Beam, MD sent at 05/02/2017  7:16 PM EST ----- MRI brain and MRA heas unremarkable. Thanks

## 2017-05-05 LAB — SPECIMEN STATUS REPORT

## 2017-05-06 ENCOUNTER — Telehealth: Payer: Self-pay | Admitting: Neurology

## 2017-05-06 ENCOUNTER — Ambulatory Visit: Payer: Medicare Other | Admitting: Physical Therapy

## 2017-05-06 DIAGNOSIS — R42 Dizziness and giddiness: Secondary | ICD-10-CM | POA: Diagnosis not present

## 2017-05-06 NOTE — Telephone Encounter (Signed)
I would try gabapentin next. If he is willing, I will order. Also, we may refer him to the [facility] that deals with exactly this if he is willing to travel to [facility]. If he agrees place orders for the following  Gabapentin 300mg  three times daily, dispense 90 with 4 refills [facility] for vertigo and let Hinton Dyer know.  Some side effects include drowsiness, weakness, tired feeling, nausea, diarrhea, constipation, blurred vision and others, stop for anything concerning thanks

## 2017-05-06 NOTE — Telephone Encounter (Signed)
Pt wanted to leave feedback on Imitrex he tried it yesterday - did not work and made him sick. Second, Zofran made him dizzy. Please recommend something else. Best call back  [phone]

## 2017-05-07 ENCOUNTER — Encounter: Payer: Self-pay | Admitting: Physical Therapy

## 2017-05-07 ENCOUNTER — Other Ambulatory Visit: Payer: Self-pay

## 2017-05-07 MED ORDER — GABAPENTIN 300 MG PO CAPS: 300.0000 mg | ORAL_CAPSULE | Freq: Three times a day (TID) | ORAL | 4 refills | Status: DC

## 2017-05-07 NOTE — Therapy (Addendum)
[facility] [address] [place], [place], [address] Phone: [phone]   Fax:  [phone]  Physical Therapy Evaluation  Patient Details  Name: Wayne Arnold MRN: [id number] Date of Birth: December 24, 1950 Referring Provider: Dr. Sarina Ill   Encounter Date: 05/06/2017  PT End of Session - 05/10/17 1414    Visit Number  1    Number of Visits  4    Date for PT Re-Evaluation  06/08/17    Authorization Type  Medicare    Authorization Time Period  05-06-17 - 07-04-17    PT Start Time  0762    PT Stop Time  0934    PT Time Calculation (min)  47 min       Past Medical History:  Diagnosis Date  . Factor 5 Leiden mutation, heterozygous ([place])   . GERD (gastroesophageal reflux disease)   . PONV (postoperative nausea and vomiting)   . Primary open angle glaucoma of both eyes   . Pulmonary embolism ([place])   . Vertigo     Past Surgical History:  Procedure Laterality Date  . BACK SURGERY  2012   two spinal fusions  . kidney stone removal    . 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 SHOULDER ARTHROSCOPY      There were no vitals filed for this visit.   Subjective Assessment - 05/10/17 1405    Subjective  Pt says symtpoms started about 3 years ago; saw ENT in [facility]; was gone from March 2017 and returned in April 2018; denies nauseau/vomiting; episode yesterday which lasted over an hour; previous episode a month ago pt states he has pulsing sound in ears when he takes hearing aids out at night    Pertinent History  R TKA, disc fusion - unknown levels - reports to have another surgery in March 2019;    Diagnostic tests  MRI and MRA on 05/01/17    Patient Stated Goals  find out what is causing dizziness and terminate symptoms         [facility] PT Assessment - 05/10/17 0001      Assessment   Medical Diagnosis  Vertigo    Referring Provider  Dr. Sarina Ill    Onset Date/Surgical Date  03/29/15    Next MD Visit   06-07-17 with Dr. Jaynee Eagles    Prior Therapy  pt was evaluated by PT in [facility] on 04-27-17 who referred him to [facility]      Precautions   Precautions  None      Balance Screen   Has the patient fallen in the past 6 months  No    Has the patient had a decrease in activity level because of a fear of falling?   No    Is the patient reluctant to leave their home because of a fear of falling?   No      Prior Function   Level of Independence  Independent    Vocation  Retired         Vestibular Assessment - 05/10/17 0001      Vestibular Assessment   General Observation  Pt presents with c/o intermittent vertigo - is wearing bil. hearing aids and glasses:  pt's left eye is higher than Rt eye but pt reports no problems with vision or with activities requiring vision      Symptom Behavior   Type of Dizziness  Spinning    Frequency of Dizziness  7-10 days  Duration of Dizziness  1 hour    Aggravating Factors  No known aggravating factors    Relieving Factors  No known relieving factors      Occulomotor Exam   Occulomotor Alignment  Abnormal Lt eye is higher than Rt eye    Spontaneous  Absent    Smooth Pursuits  Intact    Saccades  Intact      Visual Acuity   Static  line 11    Dynamic  line 9      Positional Testing   Dix-Hallpike  Dix-Hallpike Right;Dix-Hallpike Left      Dix-Hallpike Right   Dix-Hallpike Right Duration  none    Dix-Hallpike Right Symptoms  No nystagmus      Dix-Hallpike Left   Dix-Hallpike Left Duration  none    Dix-Hallpike Left Symptoms  No nystagmus         Objective measurements completed on examination: See above findings.              PT Education - 05/10/17 1412    Education provided  Yes    Education Details  eval results; etiology of vertigo is unknown at this time    Person(s) Educated  Patient    Methods  Explanation    Comprehension  Verbalized understanding          PT Long Term Goals - 05/10/17 1427       PT LONG TERM GOAL #1   Title  Independent in HEP for vestibular exercises as appropriate based on results of SOT.    Time  4    Period  Weeks    Status  New    Target Date  06/04/17      PT LONG TERM GOAL #2   Title  Complete SOT and establish goal as appropriate.    Time  4    Period  Weeks    Status  New    Target Date  06/04/17      PT LONG TERM GOAL #3   Title  Improve DHI score by at least 15 points to demo improvement in vertigo.    Baseline  DHI paper copy given to pt on 05-06-17 at initial eval    Time  4    Period  Weeks    Status  New    Target Date  06/04/17             Plan - 05/10/17 1415    Clinical Impression Statement  Pt is a 67 yr old male who presents with c/o intermittent vertigo that have been occurring for approx. 1 year.  Pt reports symptoms had subsided until March 2018 and have since occurred intermittently with no known aggravating or relieiving factors.  No nystagmus provoked with any positional testing.  Pt does present with assymetry in positioning of his eyes, with his Lt eye higher than his Rt eye.  Pt reports no problems with any visual tasks and his DVA is WNL's.      History and Personal Factors relevant to plan of care:  PE 02/2012 and acute respiratory failure with hypoxia 02/2012:  glaucoma    Clinical Presentation  Evolving    Clinical Decision Making  Moderate    Rehab Potential  Good    PT Frequency  1x / week    PT Duration  4 weeks    PT Treatment/Interventions  ADLs/Self Care Home Management;Canalith Repostioning;Balance training;Therapeutic activities;Therapeutic exercise;Patient/family education;Vestibular;Neuromuscular re-education;Gait training    PT Next Visit  Plan  do SOT and establish HEP    Consulted and Agree with Plan of Care  Patient       Patient will benefit from skilled therapeutic intervention in order to improve the following deficits and impairments:  Dizziness, Decreased balance  Visit  Diagnosis: Dizziness and giddiness - Plan: PT plan of care cert/re-cert     Problem List Patient Active Problem List   Diagnosis Date Noted  . Vertigo 04/20/2017  . Varicose veins of left lower extremity with complications 34/28/7681  . Abnormal ECG/ST segment depression 03/02/2012  . DVT, lower extremity, recurrent ([place]) 03/02/2012  . Pleuritic chest pain 03/02/2012  . Acute respiratory failure with hypoxia ([place]) 03/02/2012  . 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 Glaucoma 03/01/2012  . Low back pain 03/01/2012    Alda Lea, PT 05/10/2017, 2:33 PM  [facility] [address] [place], [place], [address] Phone: [phone]   Fax:  [phone]  Name: Wayne Arnold MRN: [id number] Date of Birth: June 27, 1950

## 2017-05-07 NOTE — Telephone Encounter (Signed)
Spoke with patient. Discussed the following in detail from Dr. Jaynee Eagles:  would try gabapentin next. If he is willing, I will order. Also, we may refer him to the [facility] that deals with exactly this if he is willing to travel to [facility]. If he agrees place orders for the following  Gabapentin 300mg  three times daily, dispense 90 with 4 refills [facility] for vertigo and let Hinton Dyer know.  Some side effects include drowsiness, weakness, tired feeling, nausea, diarrhea, constipation, blurred vision and others, stop for anything concerning thanks   -The patient verbalized understanding of all and stated that he would like to try the Gabapentin. He is aware of potential side effects. He wants to wait and discuss the [facility] referral with Dr. Jaynee Eagles at his next office visit on 3/18. He had no further questions and stated that his head feels much clearer after stopping the Ondansetron.

## 2017-05-10 NOTE — Addendum Note (Signed)
Addended by: Lamar Benes on: 05/10/2017 02:35 PM   Modules accepted: Orders

## 2017-05-13 LAB — SPECIMEN STATUS REPORT

## 2017-05-13 LAB — C-REACTIVE PROTEIN: CRP: 1 mg/L (ref 0.0–4.9)

## 2017-05-17 ENCOUNTER — Encounter: Payer: Self-pay | Admitting: Neurology

## 2017-05-18 ENCOUNTER — Ambulatory Visit: Payer: Medicare Other | Admitting: Physical Therapy

## 2017-05-18 ENCOUNTER — Encounter: Payer: Self-pay | Admitting: Physical Therapy

## 2017-05-18 DIAGNOSIS — R42 Dizziness and giddiness: Secondary | ICD-10-CM

## 2017-05-18 NOTE — Patient Instructions (Signed)
Feet Apart (Compliant Surface) Head Motion - Eyes Open    With eyes open, standing on compliant surface: _FOAM_______, feet shoulder width apart, move head slowly: up and down. Repeat __2__ times per session. Do __1__ sessions per day.  Copyright  VHI. All rights reserved.  Feet Together (Compliant Surface) Varied Arm Positions - Eyes Closed    Stand on compliant surface: __FOAM______ with feet together and arms out. Close eyes and visualize upright position. Hold_30___ seconds. Repeat  2_ times per session. Do _1___ sessions per day.  Copyright  VHI. All rights reserved.

## 2017-05-18 NOTE — Therapy (Signed)
[facility] [address] [place], [place], [address] Phone: [phone]   Fax:  [phone]  Physical Therapy Treatment  Patient Details  Name: Wayne Arnold MRN: [id number] Date of Birth: 13-Oct-1950 Referring Provider: Dr. Sarina Ill   Encounter Date: 05/18/2017  PT End of Session - 05/18/17 2040    Visit Number  2    Number of Visits  4    Date for PT Re-Evaluation  06/08/17    Authorization Type  Medicare    Authorization Time Period  05-06-17 - 07-04-17    PT Start Time  0802    PT Stop Time  3235    PT Time Calculation (min)  45 min       Past Medical History:  Diagnosis Date  . Factor 5 Leiden mutation, heterozygous ([place])   . GERD (gastroesophageal reflux disease)   . PONV (postoperative nausea and vomiting)   . Primary open angle glaucoma of both eyes   . Pulmonary embolism ([place])   . Vertigo     Past Surgical History:  Procedure Laterality Date  . BACK SURGERY  2012   two spinal fusions  . kidney stone removal    . 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 SHOULDER ARTHROSCOPY      There were no vitals filed for this visit.  Subjective Assessment - 05/18/17 2032    Subjective  Pt states he quit taking the Zofran medication and says his head feels much clearer; reports he has been feeling good with no dizziness    Pertinent History  R TKA, disc fusion - unknown levels - reports to have another surgery in March 2019;    Diagnostic tests  MRI and MRA on 05/01/17    Patient Stated Goals  find out what is causing dizziness and terminate symptoms    Currently in Pain?  Other (Comment)          Sensory Organization Test; Composite score 68/100 with N=68/100 (WNL's)  Somatosensory 87/100 with N= 90/100 Visual input WNL's Vestibular input score 40/100 with N= 54/100  Condition 1 - all 3 trials WNL's Condition 2 - Trials 1 and 2 slightly decr. And trial 3 WNL's Condition 3 - Trial 1 decr.:   Trials 2 and 3 WNL's Condition 4 - trial 1 slightly decr. ; trials 2 and 3 WNL's Condition 5 - FALL on trial 1:  Trials 2 & 3 WNL's Condition 6 - all 3 trials WNL's    DHI score 20% with 3 YES answers and 4 SOMETIMES answers; 18 answers NO    Instructed in balance on foam exercises with EO and EC for HEP            PT Education - 05/18/17 2037    Education provided  Yes    Education Details  Pt given balance on foam exercises - 4 positions with EO/EC and feet apart/together     Person(s) Educated  Patient    Methods  Explanation;Handout    Comprehension  Verbalized understanding          PT Long Term Goals - 05/10/17 1427      PT LONG TERM GOAL #1   Title  Independent in HEP for vestibular exercises as appropriate based on results of SOT.    Time  4    Period  Weeks    Status  New    Target Date  06/04/17      PT LONG TERM GOAL #2  Title  Complete SOT and establish goal as appropriate.    Time  4    Period  Weeks    Status  New    Target Date  06/04/17      PT LONG TERM GOAL #3   Title  Improve DHI score by at least 15 points to demo improvement in vertigo.    Baseline  DHI paper copy given to pt on 05-06-17 at initial eval    Time  4    Period  Weeks    Status  New    Target Date  06/04/17            Plan - 05/18/17 2043    Clinical Impression Statement  Pt has slightly decreased somatosensory input on SOT and decreased vestibular input on SOT; pt had no c/o vertigo after completion of test.  Composite score SOT WNL's with score 68/100.  Pt's status much improved at today's session compared to that at eval on 05-06-17; pt attributes improvement to discontinuing Zofran medication with elimination of side effects of this medication.      Rehab Potential  Good    PT Frequency  1x / week    PT Duration  4 weeks    PT Treatment/Interventions  ADLs/Self Care Home Management;Canalith Repostioning;Balance training;Therapeutic activities;Therapeutic  exercise;Patient/family education;Vestibular;Neuromuscular re-education;Gait training    PT Next Visit Plan  check HEP- continue vestibular exercises    Consulted and Agree with Plan of Care  Patient       Patient will benefit from skilled therapeutic intervention in order to improve the following deficits and impairments:  Dizziness, Decreased balance  Visit Diagnosis: Dizziness and giddiness     Problem List Patient Active Problem List   Diagnosis Date Noted  . Vertigo 04/20/2017  . Varicose veins of left lower extremity with complications 74/82/7078  . Abnormal ECG/ST segment depression 03/02/2012  . DVT, lower extremity, recurrent ([place]) 03/02/2012  . Pleuritic chest pain 03/02/2012  . Acute respiratory failure with hypoxia ([place]) 03/02/2012  . 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 Glaucoma 03/01/2012  . Low back pain 03/01/2012    Alda Lea, PT 05/18/2017, 8:51 PM  [facility] [address] [place], [place], [address] Phone: [phone]   Fax:  [phone]  Name: Avedis Bevis MRN: [id number] Date of Birth: [birth year]

## 2017-05-25 ENCOUNTER — Ambulatory Visit: Payer: Medicare Other | Attending: Neurology | Admitting: Physical Therapy

## 2017-05-25 DIAGNOSIS — R42 Dizziness and giddiness: Secondary | ICD-10-CM | POA: Insufficient documentation

## 2017-05-26 ENCOUNTER — Encounter: Payer: Self-pay | Admitting: Physical Therapy

## 2017-05-26 NOTE — Therapy (Signed)
[facility] [address] [place], [place], [address] Phone: [phone]   Fax:  [phone]  Physical Therapy Treatment  Patient Details  Name: Wayne Arnold MRN: [id number] Date of Birth: 07-29-1950 Referring Provider: Dr. Sarina Ill   Encounter Date: 05/25/2017  PT End of Session - 05/26/17 1401    Visit Number  3    Number of Visits  4    Date for PT Re-Evaluation  06/08/17    Authorization Type  Medicare    Authorization Time Period  05-06-17 - 07-04-17    PT Start Time  0800    PT Stop Time  0846    PT Time Calculation (min)  46 min    Equipment Utilized During Treatment  Gait belt       Past Medical History:  Diagnosis Date  . Factor 5 Leiden mutation, heterozygous ([place])   . GERD (gastroesophageal reflux disease)   . PONV (postoperative nausea and vomiting)   . Primary open angle glaucoma of both eyes   . Pulmonary embolism ([place])   . Vertigo     Past Surgical History:  Procedure Laterality Date  . BACK SURGERY  2012   two spinal fusions  . kidney stone removal    . 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 SHOULDER ARTHROSCOPY      There were no vitals filed for this visit.  Subjective Assessment - 05/26/17 1356    Subjective  Pt states he is still doing well; did the exercises at home; has not really had any vertigo this past week    Pertinent History  R TKA, disc fusion - unknown levels - reports to have another surgery in March 2019;    Diagnostic tests  MRI and MRA on 05/01/17    Patient Stated Goals  find out what is causing dizziness and terminate symptoms    Currently in Pain?  No/denies                      [facility] Adult PT Treatment/Exercise - 05/26/17 0001      Ambulation/Gait   Ambulation/Gait  Yes    Ambulation/Gait Assistance  6: Modified independent (Device/Increase time)    Ambulation Distance (Feet)  100 Feet    Assistive device  None    Gait Pattern  Within  Functional Limits    Ambulation Surface  Level;Indoor    Gait Comments  Pt amb. making circles clockwise, counterclockwise and "V" with ball for visual tracking for improved gaze stabilization     Pt amb. 17' tossing ball for improved gaze stabilization - pt had no LOB with this activity     Balance Exercises - 05/26/17 1358      Balance Exercises: Standing   Standing Eyes Opened  Narrow base of support (BOS);Wide (BOA);Head turns;Foam/compliant surface;5 reps    Standing Eyes Closed  Narrow base of support (BOS);Wide (BOA);Head turns;Foam/compliant surface;5 reps    Tandem Stance  Eyes open;2 reps;15 secs with horizontal and vertical head turns with CGA    Rockerboard  Anterior/posterior;Lateral;Head turns;EO;EC;10 reps each direction with EO and EC    Other Standing Exercises  Pt stood on Bosu inside // bars with UE support with anterior/posterior weight shifts and with head turns horizontal and vertical              PT Long Term Goals - 05/10/17 1427      PT LONG TERM GOAL #1   Title  Independent  in HEP for vestibular exercises as appropriate based on results of SOT.    Time  4    Period  Weeks    Status  New    Target Date  06/04/17      PT LONG TERM GOAL #2   Title  Complete SOT and establish goal as appropriate.    Time  4    Period  Weeks    Status  New    Target Date  06/04/17      PT LONG TERM GOAL #3   Title  Improve DHI score by at least 15 points to demo improvement in vertigo.    Baseline  DHI paper copy given to pt on 05-06-17 at initial eval    Time  4    Period  Weeks    Status  New    Target Date  06/04/17            Plan - 05/26/17 1401    Clinical Impression Statement  Pt did well maintaining balance with vestibular exercises on compliant surfaces and also with head turns; pt is progressing well towards goals    Rehab Potential  Good    PT Frequency  1x / week    PT Duration  4 weeks    PT Treatment/Interventions  ADLs/Self Care Home  Management;Canalith Repostioning;Balance training;Therapeutic activities;Therapeutic exercise;Patient/family education;Vestibular;Neuromuscular re-education;Gait training    PT Next Visit Plan  continue vestibular exercises    Consulted and Agree with Plan of Care  Patient       Patient will benefit from skilled therapeutic intervention in order to improve the following deficits and impairments:  Dizziness, Decreased balance  Visit Diagnosis: Dizziness and giddiness     Problem List Patient Active Problem List   Diagnosis Date Noted  . Vertigo 04/20/2017  . Varicose veins of left lower extremity with complications 62/69/4854  . Abnormal ECG/ST segment depression 03/02/2012  . DVT, lower extremity, recurrent ([place]) 03/02/2012  . Pleuritic chest pain 03/02/2012  . Acute respiratory failure with hypoxia ([place]) 03/02/2012  . 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 Glaucoma 03/01/2012  . Low back pain 03/01/2012    Alda Lea, PT 05/26/2017, 2:05 PM  [facility] [address] [place], [place], [address] Phone: [phone]   Fax:  [phone]  Name: Wayne Arnold MRN: [id number] Date of Birth: 1950-08-19

## 2017-06-07 ENCOUNTER — Ambulatory Visit (INDEPENDENT_AMBULATORY_CARE_PROVIDER_SITE_OTHER): Payer: Medicare Other | Admitting: Neurology

## 2017-06-07 ENCOUNTER — Encounter: Payer: Self-pay | Admitting: Neurology

## 2017-06-07 VITALS — BP 113/73 | HR 50 | Ht 75.0 in | Wt 197.0 lb

## 2017-06-07 DIAGNOSIS — R42 Dizziness and giddiness: Secondary | ICD-10-CM | POA: Diagnosis not present

## 2017-06-07 NOTE — Patient Instructions (Signed)
Dizziness °Dizziness is a common problem. It is a feeling of unsteadiness or light-headedness. You may feel like you are about to faint. Dizziness can lead to injury if you stumble or fall. Anyone can become dizzy, but dizziness is more common in older adults. This condition can be caused by a number of things, including medicines, dehydration, or illness. °Follow these instructions at home: °Eating and drinking °· Drink enough fluid to keep your urine clear or pale yellow. This helps to keep you from becoming dehydrated. Try to drink more clear fluids, such as water. °· Do not drink alcohol. °· Limit your caffeine intake if told to do so by your health care provider. Check ingredients and nutrition facts to see if a food or beverage contains caffeine. °· Limit your salt (sodium) intake if told to do so by your health care provider. Check ingredients and nutrition facts to see if a food or beverage contains sodium. °Activity °· Avoid making quick movements. °? Rise slowly from chairs and steady yourself until you feel okay. °? In the morning, first sit up on the side of the bed. When you feel okay, stand slowly while you hold onto something until you know that your balance is fine. °· If you need to stand in one place for a long time, move your legs often. Tighten and relax the muscles in your legs while you are standing. °· Do not drive or use heavy machinery if you feel dizzy. °· Avoid bending down if you feel dizzy. Place items in your home so that they are easy for you to reach without leaning over. °Lifestyle °· Do not use any products that contain nicotine or tobacco, such as cigarettes and e-cigarettes. If you need help quitting, ask your health care provider. °· Try to reduce your stress level by using methods such as yoga or meditation. Talk with your health care provider if you need help to manage your stress. °General instructions °· Watch your dizziness for any changes. °· Take over-the-counter and  prescription medicines only as told by your health care provider. Talk with your health care provider if you think that your dizziness is caused by a medicine that you are taking. °· Tell a friend or a family member that you are feeling dizzy. If he or she notices any changes in your behavior, have this person call your health care provider. °· Keep all follow-up visits as told by your health care provider. This is important. °Contact a health care provider if: °· Your dizziness does not go away. °· Your dizziness or light-headedness gets worse. °· You feel nauseous. °· You have reduced hearing. °· You have new symptoms. °· You are unsteady on your feet or you feel like the room is spinning. °Get help right away if: °· You vomit or have diarrhea and are unable to eat or drink anything. °· You have problems talking, walking, swallowing, or using your arms, hands, or legs. °· You feel generally weak. °· You are not thinking clearly or you have trouble forming sentences. It may take a friend or family member to notice this. °· You have chest pain, abdominal pain, shortness of breath, or sweating. °· Your vision changes. °· You have any bleeding. °· You have a severe headache. °· You have neck pain or a stiff neck. °· You have a fever. °These symptoms may represent a serious problem that is an emergency. Do not wait to see if the symptoms will go away. Get medical help   right away. Call your local emergency services (911 in the [place] Do not drive yourself to the hospital. °Summary °· Dizziness is a feeling of unsteadiness or light-headedness. This condition can be caused by a number of things, including medicines, dehydration, or illness. °· Anyone can become dizzy, but dizziness is more common in older adults. °· Drink enough fluid to keep your urine clear or pale yellow. Do not drink alcohol. °· Avoid making quick movements if you feel dizzy. Monitor your dizziness for any changes. °This information is not intended to  replace advice given to you by your health care provider. Make sure you discuss any questions you have with your health care provider. °Document Released: 09/02/2000 Document Revised: 04/11/2016 Document Reviewed: 04/11/2016 °Elsevier Interactive Patient Education © 2018 Elsevier Inc. ° °

## 2017-06-07 NOTE — Progress Notes (Signed)
[facility]    Provider:  Dr Jaynee Eagles Referring Provider: Hassell Done MD Primary Care Physician:  Concepcion Elk, MD  CC:  Vertigo  Interval history 06/07/2017: Patient here for follow up of vertigo. He has working with PT vestibular therapy, unsure if it helped at all.  Gabapentin seems to have helped for his symptoms as has PT. MRI brain/MRA of the head was unrevealing for any cause, reviewed imaging with patient and wife and answered all questions. The gabapentin helped a littl - Imitrex, zofran, meclizine, nortriptyline, verapamil did not help. No significant headaches or migrainous qualities. He takes gabapentin which he feels has helped. He has been taking 300mg  twice a day and will go up to 3x a day. He tried Nortriptyline in the past, verapamil and did not help or tolerate. Still having episodes of vertigo. Also having episodes of equilibrium difficulty, disorientation, full head feeling for about an hour, 3pm in the afternoon watching television last Friday and he has the episode. They last about an hour.   HPI:  Wayne Arnold is a 67 y.o. male here as a referral from Dr. Loistine Simas for dizziness and giddiness.  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  Onset of vertigo January 6 in 2017 the day after retirement. He woke up and felt slight dizziness, 7 days later it happened again, it was nausea and room spinning. He saw a specialist in meniere's and said he did not have it. In April 2017 the symptoms went away for a year then started again in May of 2018 and off anf on since then. Last episode was 2 weeks ago. He has been to multiple doctors and tried multiple preventatives in acute management which have not worked.  Dix-Hallpike maneuver produced no vertigo with either ear down.  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  He apparently has done physical therapy at home with no improvement.  Patient has seen 2 ENT  doctors Dr. Carolanne Grumbling and Dr. Freddy Jaksch in [place] as well as a doctor at [facility] who referred him here.  Episodes can last over an hour.  They are episodic, may not have one for 13 months.  Described as spinning of the room.  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  He is tried diuretic therapy in the past. The nortriptyline helped a little but episodes became more frequent, verapamll possibly helped. Symptoms acocmpanied by ear ache. The room spins, nausea, imbalance, vomiting and sweating. No hx of migraines. Denies any headaches with the episodes. No light or sound sensitivity or migrainous symptoms. He gets irritable. No dysequilibrium. Hearing loss of the right ear, he can feel his pulse at night pulsatile tinnitus. He has had some right sided headache but not migrainous.  Medications tried: Imitrex during episodes, nortriptyline, verapamil.  Valium, meclizine, diazepam and Imitrex did not help his acute symptoms.  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  Also tried prednisone.  Reviewed notes, labs and imaging from outside physicians, which showed:  MRi brain 2017: Diffusion imaging does not show any acute or subacute infarction. The brainstem shows some chronic small-vessel ischemic change affecting the pons. No focal cerebellar insult. CP angle regions are normal. Seventh and eighth nerve complexes are normal. No vestibular schwannoma. No evidence of neuritis. No fluid in the middle ears or mastoids.  Cerebral hemispheres show mild chronic small-vessel change of the white matter. No cortical  or large vessel territory infarction. No mass lesion, hemorrhage, hydrocephalus or extra-axial fluid collection. No abnormal supratentorial enhancement.  IMPRESSION: No specific cause of the presenting symptoms is identified. Chronic small-vessel changes affecting the pons in the cerebral hemispheric white matter, mild in degree but premature for  age. No vestibular schwannoma.  Reviewed notes.  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  Onset was January 2017 with intermittent episodes of spinning vertigo with right ear fullness.  Valium and meclizine did not help his acute symptoms.  MRI of the brain by report was "okay".  He briefly saw a physician in [place], Dr. Freddy Jaksch.  He was evaluated by ear nose and throat.  Vestibular migraine was a possibility.  He was started on nortriptyline 50 mg a day still having a moderate number of episodes though less in severity.  Exam showed normal external canals and tympanic membranes bilaterally.  He was switched off nortriptyline to verapamil.  Physical exam normal, exterior nose normal, larynx normal, nasopharynx normal, in fact the entire exam was normal.  Dix-Hallpike did not produce symptoms.  Review of Systems: Patient complains of symptoms per HPI as well as the following symptoms: dizziness, nausea. Pertinent negatives and positives per HPI. All others negative.   Social History   Socioeconomic History  . Marital status: Married    Spouse name: Not on file  . Number of children: 0  . Years of education: Not on file  . Highest education level: Some college, no degree  Social Needs  . Financial resource strain: Not on file  . Food insecurity - worry: Not on file  . Food insecurity - inability: Not on file  . Transportation needs - medical: Not on file  . Transportation needs - non-medical: Not on file  Occupational History  . Not on file  Tobacco Use  . Smoking status: Never Smoker  . Smokeless tobacco: Never Used  Substance and Sexual Activity  . Alcohol use: Yes    Comment: occasional  . Drug use: No  . Sexual activity: Yes  Other Topics Concern  . Not on file  Social History Narrative   Lives at home with wife and dog    Right handed   Drinks 2 cups of caffeine daily    Family History  Problem Relation Age of Onset  . Alzheimer's disease Mother   . COPD Father   . Diabetes Father   . Glaucoma Paternal Grandmother     Past Medical History:  Diagnosis Date  . Factor 5 Leiden mutation, heterozygous ([place])   . GERD (gastroesophageal reflux disease)   . PONV (postoperative nausea and vomiting)   . Primary open angle glaucoma of both eyes   . Pulmonary embolism ([place])   . Vertigo     Past Surgical History:  Procedure Laterality Date  . BACK SURGERY  2012   two spinal fusions  . kidney stone removal    . 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 SHOULDER ARTHROSCOPY      Current Outpatient Medications  Medication Sig Dispense Refill  . apixaban (ELIQUIS) 2.5 MG TABS tablet Take 2.5 mg by mouth 2 (two) times daily.    Marland Kitchen gabapentin (NEURONTIN) 300 MG capsule Take 1 capsule (300 mg total) by mouth 3 (three) times daily. (Patient taking differently: Take 300 mg by mouth 2 (two) times daily. ) 90 capsule  4  . Tafluprost 0.0015 % SOLN Apply 1 drop to eye daily.    . timolol (BETIMOL) 0.25 % ophthalmic solution 1-2 drops 2 (two) times daily.     No current facility-administered medications for this visit.     Allergies as of 06/07/2017 - Review Complete 06/07/2017  Allergen Reaction Noted  . Codeine Nausea Only 03/01/2012  . Meperidine Nausea Only 03/01/2012    Vitals: BP 113/73 (BP Location: Right Arm, Patient Position: Sitting)   Pulse (!) 50   Ht 6\' 3"  (1.905 m)   Wt 197 lb (89.4 kg)   BMI 24.62 kg/m  Last Weight:  Wt Readings from Last 1 Encounters:  06/07/17 197 lb (89.4 kg)   Last Height:   Ht Readings from Last 1 Encounters:  06/07/17 6\' 3"  (1.905 m)   Physical exam: Exam: Gen: appears distressed        CV: RRR, no MRG. No Carotid Bruits. No peripheral edema, warm, nontender Eyes: Conjunctivae clear without exudates or hemorrhage  Neuro: Detailed Neurologic  Exam  Speech:    Speech is normal; fluent and spontaneous with normal comprehension.  Cognition:    The patient is oriented to person, place, and time;     recent and remote memory intact;     language fluent;     normal attention, concentration,     fund of knowledge Cranial Nerves:    The pupils are equal, round, and reactive to light. The fundi are normal and spontaneous venous pulsations are present. Visual fields are full to finger confrontation. Extraocular movements are intact. Trigeminal sensation is intact and the muscles of mastication are normal. The face is symmetric. The palate elevates in the midline. Hearing intact. Voice is normal. Shoulder shrug is normal. The tongue has normal motion without fasciculations.   Coordination:    Normal finger to nose and heel to shin. Normal rapid alternating movements.   Gait:    Not ataxic  Motor Observation:    No asymmetry, no atrophy, and no involuntary movements noted. Tone:    Normal muscle tone.    Posture:    Posture is normal. normal erect    Strength:    Strength is V/V in the upper and lower limbs.      Sensation: intact to LT     Reflex Exam:  DTR's:    Deep tendon reflexes in the upper and lower extremities are symmetrical bilaterally.   Toes:    The toes are downgoing bilaterally.   Clonus:    Clonus is absent.       Assessment/Plan:  Patient with intractable vertigo,has seen multiple ENTs, had vestibular therapy in the past (will resend), medications not helping. Significant impairment. They last about an hour and occu weekly to biweekly.   - Patient does not have a history of migraines, he had one brief slight headache with ear pain and there were no migrainous features. But he does report some right-sided head "fuzziness" unclear if could classify this as a headache. I think this is unlikely vestibular migraines but can't rule it out since everything else is negative but agree the case is perplexing.    - MRI brain w/wo contrast unremarkable - MRA head unremarkable - Vestibular therapy has not helped - Continue Neurontin - Would try an Depakote next (has glaucoma, wouldn;t use Topiramate as can worsen this) - Recommend cardiac evaluation per pcp  Referred to [facility]:  Orders Placed This Encounter  Procedures  . Ambulatory referral to  ENT   To prevent or relieve headaches, try the following: Cool Compress. Lie down and place a cool compress on your head.  Avoid headache triggers. If certain foods or odors seem to have triggered your migraines in the past, avoid them. A headache diary might help you identify triggers.  Include physical activity in your daily routine. Try a daily walk or other moderate aerobic exercise.  Manage stress. Find healthy ways to cope with the stressors, such as delegating tasks on your to-do list.  Practice relaxation techniques. Try deep breathing, yoga, massage and visualization.  Eat regularly. Eating regularly scheduled meals and maintaining a healthy diet might help prevent headaches. Also, drink plenty of fluids.  Follow a regular sleep schedule. Sleep deprivation might contribute to headaches Consider biofeedback. With this mind-body technique, you learn to control certain bodily functions - such as muscle tension, heart rate and blood pressure - to prevent headaches or reduce headache pain.    Proceed to emergency room if you experience new or worsening symptoms or symptoms do not resolve, if you have new neurologic symptoms or if headache is severe, or for any concerning symptom.   Provided education and documentation from American headache Society toolbox including articles on: chronic migraine medication overuse headache, chronic migraines, prevention of migraines, behavioral and other nonpharmacologic treatments for headache.  Cc: Concepcion Elk, MD, Dr. Laurance Flatten  A total of 25 minutes was spent in with this patient. Over half this  time was spent on counseling patient on the vertigo diagnosis and different therapeutic options available.     Sarina Ill, MD  [facility] [address] [place], [place] [address]  Phone [phone] Fax [phone]

## 2017-06-08 ENCOUNTER — Ambulatory Visit: Payer: Medicare Other | Admitting: Physical Therapy

## 2017-06-08 DIAGNOSIS — R42 Dizziness and giddiness: Secondary | ICD-10-CM | POA: Diagnosis not present

## 2017-06-09 ENCOUNTER — Encounter: Payer: Self-pay | Admitting: Physical Therapy

## 2017-06-09 NOTE — Therapy (Signed)
[facility] [address] [place], [place], [address] Phone: [phone]   Fax:  [phone]  Physical Therapy Treatment  Patient Details  Name: Wayne Arnold MRN: [id number] Date of Birth: [birth year] Referring Provider: Dr. Sarina Ill   Encounter Date: 06/08/2017  PT End of Session - 06/09/17 1435    Visit Number  4    Number of Visits  4    Date for PT Re-Evaluation  06/08/17    Authorization Type  Medicare    Authorization Time Period  05-06-17 - 07-04-17    PT Start Time  0801    PT Stop Time  0846    PT Time Calculation (min)  45 min       Past Medical History:  Diagnosis Date  . Factor 5 Leiden mutation, heterozygous ([place])   . GERD (gastroesophageal reflux disease)   . PONV (postoperative nausea and vomiting)   . Primary open angle glaucoma of both eyes   . Pulmonary embolism ([place])   . Vertigo     Past Surgical History:  Procedure Laterality Date  . BACK SURGERY  2012   two spinal fusions  . kidney stone removal    . 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 SHOULDER ARTHROSCOPY      There were no vitals filed for this visit.  Subjective Assessment - 06/09/17 1433    Subjective  Pt states he had an episode of "disorientation" on Friday - lasted about an hour and 1/2 ; saw Dr. Jaynee Eagles on Monday (06-07-17) - says she is going to refer him to Vestibular clinic at [facility]; pt states vestibular therapy overall has not helped as episodes continue to occur and are unpredictable     Pertinent History  R TKA, disc fusion - unknown levels - reports to have another surgery in March 2019;    Diagnostic tests  MRI and MRA on 05/01/17    Patient Stated Goals  find out what is causing dizziness and terminate symptoms    Currently in Pain?  No/denies                  Pt amb. 29' tossing ball for improved gaze stabilization - pt had no LOB with this activity Pt performed dynamic standing balance activities  on incline/decline with EO and EC- with head stationary and then with head turns- Horizontal and vertical Pt amb up/down incline with EC with CGA for safety  Pt performed 180 degrees turns on blue mat for increased vestibular input with maintaining balance - CGA for safety          Balance Exercises - 06/09/17 1434      Balance Exercises: Standing   Standing Eyes Opened  Narrow base of support (BOS);Wide (BOA);Head turns;Foam/compliant surface;5 reps    Standing Eyes Closed  Narrow base of support (BOS);Wide (BOA);Head turns;Foam/compliant surface;5 reps    Tandem Stance  Eyes open;2 reps;15 secs with horizontal and vertical head turns with CGA    Rockerboard  Anterior/posterior;Lateral;Head turns;EO;EC;10 reps each direction with EO and EC    Other Standing Exercises  Pt stood on Bosu inside // bars with UE support with anterior/posterior weight shifts and with head turns horizontal and vertical              PT Long Term Goals - 06/08/17 0809      PT LONG TERM GOAL #1   Title  Independent in HEP for vestibular exercises as appropriate based on results of  SOT.    Baseline  met 06-08-17    Time  4    Period  Weeks    Status  Achieved      PT LONG TERM GOAL #2   Title  Complete SOT and establish goal as appropriate.    Baseline  Goal deferred as composite score WNL's at 68/100; visual input WNL's and vestibular input slightly decr. at 40/100 with N= 54/100    Status  Deferred      PT LONG TERM GOAL #3   Title  Improve DHI score by at least 15 points to demo improvement in vertigo.    Baseline  DHI paper copy given to pt on 05-06-17 at initial eval; score 20% - mild handicap group with this score ; pt reports no major change as he continues to have episodes which spontaneously occur - pt only had 1 episode (on Friday, 06-04-17 ) since 05-25-17              Patient will benefit from skilled therapeutic intervention in order to improve the following deficits and  impairments:     Visit Diagnosis: Dizziness and giddiness     Problem List Patient Active Problem List   Diagnosis Date Noted  . Vertigo 04/20/2017  . Varicose veins of left lower extremity with complications 77/41/2878  . Abnormal ECG/ST segment depression 03/02/2012  . DVT, lower extremity, recurrent ([place]) 03/02/2012  . Pleuritic chest pain 03/02/2012  . Acute respiratory failure with hypoxia ([place]) 03/02/2012  . 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 Glaucoma 03/01/2012  . Low back pain 03/01/2012    PHYSICAL THERAPY DISCHARGE SUMMARY  Visits from Start of Care: 4  Current functional level related to goals / functional outcomes:  See above for progress towards goals - pt states no significant improvement with vestibular rehab, however, pt reports no dizziness during PT activities requiring increased vestibular input;  Pt's DHI score is 20% (mild handicap category) Composite score of SOT is WNL's at 68/100:   Etiology of vertigo remains unknown at this time  Remaining deficits: Pt continues to report episodic vertigo with most recent episode occurring on 06-04-17 in the afternoon and lasting approx. 1.5 hours No vertigo has been provoked during any PT session   Education / Equipment: Pt has been instructed in HEP consisting of balance/vestibular exercises and x1 viewing exercise Plan: Patient agrees to discharge.  Patient goals were partially met. Patient is being discharged due to the patient's request.  ?????       Pt has back surgery scheduled next week; pt is being referred to [facility] for further diagnostic testing.      [facility], NOBSJ GGEZMOQ, PT 06/09/2017, 2:45 PM  [facility] [address] [place], [place], [address] Phone: [phone]   Fax:  [phone]  Name: Wayne Arnold MRN: [id number] Date of Birth: February 25, 1951

## 2017-06-15 ENCOUNTER — Encounter: Payer: PRIVATE HEALTH INSURANCE | Admitting: Physical Therapy

## 2018-01-17 ENCOUNTER — Other Ambulatory Visit: Payer: Self-pay | Admitting: Neurology
